# Patient Record
Sex: Female | Born: 1945
Health system: Southern US, Community
[De-identification: ages and names within clinical notes are randomized; demographics above are authoritative.]

## PROBLEM LIST (undated history)

## (undated) DIAGNOSIS — E785 Hyperlipidemia, unspecified: Secondary | ICD-10-CM

## (undated) DIAGNOSIS — F329 Major depressive disorder, single episode, unspecified: Secondary | ICD-10-CM

## (undated) DIAGNOSIS — I10 Essential (primary) hypertension: Secondary | ICD-10-CM

## (undated) DIAGNOSIS — E559 Vitamin D deficiency, unspecified: Secondary | ICD-10-CM

## (undated) DIAGNOSIS — F32A Depression, unspecified: Secondary | ICD-10-CM

## (undated) DIAGNOSIS — M8580 Other specified disorders of bone density and structure, unspecified site: Secondary | ICD-10-CM

## (undated) DIAGNOSIS — T7840XA Allergy, unspecified, initial encounter: Secondary | ICD-10-CM

## (undated) DIAGNOSIS — R7303 Prediabetes: Secondary | ICD-10-CM

## (undated) DIAGNOSIS — A159 Respiratory tuberculosis unspecified: Secondary | ICD-10-CM

## (undated) DIAGNOSIS — M1990 Unspecified osteoarthritis, unspecified site: Secondary | ICD-10-CM

## (undated) DIAGNOSIS — H2000 Unspecified acute and subacute iridocyclitis: Secondary | ICD-10-CM

## (undated) DIAGNOSIS — F419 Anxiety disorder, unspecified: Secondary | ICD-10-CM

## (undated) HISTORY — PX: DILATION AND CURETTAGE OF UTERUS: SHX78

## (undated) HISTORY — DX: Essential (primary) hypertension: I10

## (undated) HISTORY — DX: Prediabetes: R73.03

## (undated) HISTORY — DX: Other specified disorders of bone density and structure, unspecified site: M85.80

## (undated) HISTORY — DX: Vitamin D deficiency, unspecified: E55.9

## (undated) HISTORY — DX: Major depressive disorder, single episode, unspecified: F32.9

## (undated) HISTORY — DX: Depression, unspecified: F32.A

## (undated) HISTORY — DX: Allergy, unspecified, initial encounter: T78.40XA

## (undated) HISTORY — DX: Unspecified acute and subacute iridocyclitis: H20.00

## (undated) HISTORY — DX: Unspecified osteoarthritis, unspecified site: M19.90

## (undated) HISTORY — DX: Anxiety disorder, unspecified: F41.9

## (undated) HISTORY — DX: Hyperlipidemia, unspecified: E78.5

## (undated) HISTORY — PX: TONSILLECTOMY AND ADENOIDECTOMY: SHX28

---

## 1999-10-01 ENCOUNTER — Emergency Department (HOSPITAL_COMMUNITY): Admission: EM | Admit: 1999-10-01 | Discharge: 1999-10-01 | Payer: Self-pay | Admitting: Emergency Medicine

## 2000-01-22 ENCOUNTER — Other Ambulatory Visit: Admission: RE | Admit: 2000-01-22 | Discharge: 2000-01-22 | Payer: Self-pay | Admitting: *Deleted

## 2001-09-22 ENCOUNTER — Other Ambulatory Visit: Admission: RE | Admit: 2001-09-22 | Discharge: 2001-09-22 | Payer: Self-pay | Admitting: Internal Medicine

## 2004-06-17 ENCOUNTER — Other Ambulatory Visit: Admission: RE | Admit: 2004-06-17 | Discharge: 2004-06-17 | Payer: Self-pay | Admitting: Internal Medicine

## 2004-08-15 ENCOUNTER — Ambulatory Visit (HOSPITAL_COMMUNITY): Admission: RE | Admit: 2004-08-15 | Discharge: 2004-08-15 | Payer: Self-pay | Admitting: Gastroenterology

## 2004-09-04 ENCOUNTER — Encounter: Admission: RE | Admit: 2004-09-04 | Discharge: 2004-09-04 | Payer: Self-pay | Admitting: Gastroenterology

## 2006-11-15 ENCOUNTER — Ambulatory Visit (HOSPITAL_COMMUNITY): Admission: RE | Admit: 2006-11-15 | Discharge: 2006-11-15 | Payer: Self-pay | Admitting: Chiropractic Medicine

## 2007-05-26 ENCOUNTER — Encounter: Admission: RE | Admit: 2007-05-26 | Discharge: 2007-05-26 | Payer: Self-pay | Admitting: Sports Medicine

## 2007-06-09 ENCOUNTER — Encounter: Admission: RE | Admit: 2007-06-09 | Discharge: 2007-06-09 | Payer: Self-pay | Admitting: Sports Medicine

## 2008-09-25 ENCOUNTER — Other Ambulatory Visit: Admission: RE | Admit: 2008-09-25 | Discharge: 2008-09-25 | Payer: Self-pay | Admitting: Internal Medicine

## 2009-09-03 ENCOUNTER — Encounter: Admission: RE | Admit: 2009-09-03 | Discharge: 2009-09-03 | Payer: Self-pay | Admitting: Internal Medicine

## 2010-04-06 ENCOUNTER — Encounter: Payer: Self-pay | Admitting: Gastroenterology

## 2010-04-07 ENCOUNTER — Encounter: Payer: Self-pay | Admitting: Internal Medicine

## 2010-08-01 NOTE — Consult Note (Signed)
[place]  Patient:    Allison Barker, Allison Barker                         MRN: [id number] Proc. Date: 10/01/99 Adm. Date:  [id number] Attending:  Doug Sou                          Consultation Report  EMERGENCY ROOM NOTE  REASON FOR CONSULTATION:  Epistaxis.  HISTORY OF PRESENT ILLNESS:  This is a 65 year old lady who was seen in our office three days prior and was found to have a small arterial bleeder in the right anterior septum.  This was cauterized with silver nitrate, and she did well until last night when she started having some small amounts of bleeding and then this morning had heavy bleeding.  She was evaluated in the emergency room.  Nasal exam reveals cotton packing with cocaine in the right nasal cavity.  The left side is clear.  The packing was removed.  There is a silver nitrate cautery site on the anterior septum on the right side.  This area was abraded with a Frazier tip suction, and this resulted in brisk bleeding from the posteroinferior aspect of the cautery site.  This area was infiltrated with Xylocaine with epinephrine.  A suction cautery device was used on a setting of 20 to cauterize this vessel thoroughly.  There was no further bleeding.  The nasal cavity was cleaned of old blood and clot.  She was observed in the emergency room for 30 minutes and discharged to home in good condition without any further bleeding.  DISCHARGE INSTRUCTIONS:  She was instructed to use Vaseline ointment in her nose several times a day and nasal saline spray to keep things moist.  FOLLOW-UP:  She was instructed to follow up in one to two weeks if she is doing well, or sooner if she has any further bleeding. DD:  10/01/99 TD:  10/01/99 Job: [id number]

## 2010-08-01 NOTE — Op Note (Signed)
Allison Barker, Allison Barker                ACCOUNT NO.:  [id number]   MEDICAL RECORD NO.:  [id number]          PATIENT TYPE:  AMB   LOCATION:  ENDO                         FACILITY:  [facility]   PHYSICIAN:  Petra Kuba, M.D.    DATE OF BIRTH:  26-Dec-1945   DATE OF PROCEDURE:  08/15/2004  DATE OF DISCHARGE:                                 OPERATIVE REPORT   PROCEDURE:  Colonoscopy.   INDICATIONS:  Patient overdue for colonic screening with lower abdominal  pain and increasing constipation.  Consent was signed after the risks,  benefits, methods and options were thoroughly discussed in the office.   MEDICINES USED:  1.  Demerol 60 mg.  2.  Versed 10 mg.   DESCRIPTION OF PROCEDURE:  Rectal inspection was pertinent for external  hemorrhoids.  Digital exam was negative.  The video pediatric colonoscope  was inserted and with some abdominal pressure was easily able to be advanced  to the cecum.  No abnormalities were seen on insertion.  We did advance into  the terminal ileum, which was normal.  Photo documentation was obtained.  The scope was slowly withdrawn.  The cecum was identified by the appendiceal  orifice and the ileocecal valve.  The prep was fairly adequate.  There was  some stool adherent to the wall which could not be washed off, but with  moderate washing and suctioning, fairly adequate visualization was obtained.  On slow withdrawal back to the rectum, no polyps, tumors, masses,  diverticula or any other abnormalities were seen as we slowly withdrew back  to the rectum.  Anorectal pull through and retroflexion confirmed some small  hemorrhoids.  The scope was straightened and readvanced a short ways up the  left side of the colon.  Air was suctioned and the scope was removed.  The  patient tolerated the procedure well.  There was no obvious immediate  complication.   ENDOSCOPIC DIAGNOSES:  1.  Internal and external hemorrhoids.  2.  Otherwise within normal limits to the terminal  ileum.   PLAN:  Recheck colon screening in five years.  Happy to see back p.r.n.,  particularly if symptoms continue.  Might need to try stool softeners,  fibers, increased fluids, etc.  If pain continues, will probably get a CT  scan next.  Could leave that to either Dr. Ambrose Mantle or Dr. Elisabeth Most or as  above.  Happy to see back.      MEM/MEDQ  D:  08/15/2004  T:  08/17/2004  Job:  [id number]   cc:   Lovenia Kim, D.O.  [address]  [place]  [place] [address]  Fax: [phone]   Malachi Pro. Ambrose Mantle, M.D.  [address]  [place]  [place] [address]  Fax: [phone]

## 2010-10-06 ENCOUNTER — Other Ambulatory Visit (HOSPITAL_COMMUNITY)
Admission: RE | Admit: 2010-10-06 | Discharge: 2010-10-06 | Disposition: A | Discharge status: Home / Self Care | Payer: BC Managed Care – PPO | Source: Ambulatory Visit | Attending: Internal Medicine | Admitting: Internal Medicine

## 2010-10-06 DIAGNOSIS — Z01419 Encounter for gynecological examination (general) (routine) without abnormal findings: Secondary | ICD-10-CM | POA: Insufficient documentation

## 2011-08-05 ENCOUNTER — Ambulatory Visit (HOSPITAL_COMMUNITY)
Admission: RE | Admit: 2011-08-05 | Discharge: 2011-08-05 | Disposition: A | Discharge status: Home / Self Care | Payer: Medicare Other | Source: Ambulatory Visit | Attending: Internal Medicine | Admitting: Internal Medicine

## 2011-08-05 ENCOUNTER — Other Ambulatory Visit (HOSPITAL_COMMUNITY): Payer: Self-pay | Admitting: Internal Medicine

## 2011-08-05 DIAGNOSIS — J40 Bronchitis, not specified as acute or chronic: Secondary | ICD-10-CM | POA: Insufficient documentation

## 2011-08-05 DIAGNOSIS — R05 Cough: Secondary | ICD-10-CM

## 2011-08-05 DIAGNOSIS — R059 Cough, unspecified: Secondary | ICD-10-CM | POA: Insufficient documentation

## 2012-11-14 HISTORY — PX: EYE SURGERY: SHX253

## 2013-03-08 ENCOUNTER — Other Ambulatory Visit: Payer: Self-pay | Admitting: Emergency Medicine

## 2013-03-08 MED ORDER — BISOPROLOL-HYDROCHLOROTHIAZIDE 10-6.25 MG PO TABS: 1.0000 | ORAL_TABLET | Freq: Every day | ORAL | Status: DC

## 2013-03-13 ENCOUNTER — Other Ambulatory Visit: Payer: Self-pay | Admitting: Emergency Medicine

## 2013-03-13 MED ORDER — BISOPROLOL-HYDROCHLOROTHIAZIDE 5-6.25 MG PO TABS: 1.0000 | ORAL_TABLET | Freq: Every day | ORAL | Status: DC

## 2013-06-08 ENCOUNTER — Encounter: Payer: Self-pay | Admitting: Physician Assistant

## 2013-06-08 ENCOUNTER — Ambulatory Visit (INDEPENDENT_AMBULATORY_CARE_PROVIDER_SITE_OTHER): Payer: Medicare Other | Admitting: Physician Assistant

## 2013-06-08 VITALS — BP 120/72 | HR 72 | Temp 98.4°F | Resp 16 | Ht 63.5 in | Wt 170.0 lb

## 2013-06-08 DIAGNOSIS — R7309 Other abnormal glucose: Secondary | ICD-10-CM | POA: Insufficient documentation

## 2013-06-08 DIAGNOSIS — Z79899 Other long term (current) drug therapy: Secondary | ICD-10-CM

## 2013-06-08 DIAGNOSIS — J329 Chronic sinusitis, unspecified: Secondary | ICD-10-CM

## 2013-06-08 DIAGNOSIS — R7303 Prediabetes: Secondary | ICD-10-CM

## 2013-06-08 DIAGNOSIS — F411 Generalized anxiety disorder: Secondary | ICD-10-CM

## 2013-06-08 DIAGNOSIS — E785 Hyperlipidemia, unspecified: Secondary | ICD-10-CM

## 2013-06-08 DIAGNOSIS — I10 Essential (primary) hypertension: Secondary | ICD-10-CM | POA: Insufficient documentation

## 2013-06-08 DIAGNOSIS — E559 Vitamin D deficiency, unspecified: Secondary | ICD-10-CM | POA: Insufficient documentation

## 2013-06-08 MED ORDER — PREDNISONE 20 MG PO TABS: ORAL_TABLET | ORAL | Status: DC

## 2013-06-08 MED ORDER — FUROSEMIDE 40 MG PO TABS: 40.0000 mg | ORAL_TABLET | Freq: Every day | ORAL | Status: DC

## 2013-06-08 MED ORDER — ALPRAZOLAM 0.5 MG PO TABS: 0.5000 mg | ORAL_TABLET | Freq: Every evening | ORAL | Status: DC | PRN

## 2013-06-08 MED ORDER — PROMETHAZINE-CODEINE 6.25-10 MG/5ML PO SYRP: 5.0000 mL | ORAL_SOLUTION | Freq: Four times a day (QID) | ORAL | Status: DC | PRN

## 2013-06-08 MED ORDER — AZITHROMYCIN 250 MG PO TABS: ORAL_TABLET | ORAL | Status: DC

## 2013-06-08 NOTE — Progress Notes (Signed)
HPI 68 y.o. female  presents for 3 month follow up with hypertension, hyperlipidemia, prediabetes and vitamin D. Her blood pressure has been controlled at home, today their BP is BP: 120/72 mmHg She does not workout. She denies chest pain, shortness of breath, dizziness.  She is not on cholesterol medication and denies myalgias. Her cholesterol is not at goal. The cholesterol last visit was:  LDL 120 She has been working on diet and exercise for prediabetes, and denies paresthesia of the feet, polydipsia, polyuria and visual disturbances. Last A1C in the office was: 5.5 She has been having dry cough, rhinorrhea for one week, she has been taking zyrtec and mucinex that has not helped. Denies SOB, wheezing.  Patient is on Vitamin D supplement.    Current Medications:    Medication List       This list is accurate as of: 06/08/13  5:36 PM.  Always use your most recent med list.               acyclovir 400 MG tablet  Commonly known as:  ZOVIRAX  Take 400 mg by mouth 2 (two) times daily.     ALPRAZolam 0.5 MG tablet  Commonly known as:  XANAX  Take 1 tablet (0.5 mg total) by mouth at bedtime as needed for anxiety.     aspirin 81 MG tablet  Take 81 mg by mouth every other day.     azithromycin 250 MG tablet  Commonly known as:  ZITHROMAX  2 tablets by mouth today then one tablet daily for 4 days.     bisoprolol-hydrochlorothiazide 5-6.25 MG per tablet  Commonly known as:  ZIAC  Take 1 tablet by mouth daily.     cetirizine 10 MG tablet  Commonly known as:  ZYRTEC  Take 10 mg by mouth daily.     furosemide 40 MG tablet  Commonly known as:  LASIX  Take 1 tablet (40 mg total) by mouth daily.     LORazepam 2 MG tablet  Commonly known as:  ATIVAN  Take 2 mg by mouth at bedtime as needed for anxiety.     potassium chloride 10 MEQ tablet  Commonly known as:  K-DUR,KLOR-CON  Take 10 mEq by mouth 2 (two) times daily.     pravastatin 40 MG tablet  Commonly known as:  PRAVACHOL   Take 40 mg by mouth daily.     predniSONE 20 MG tablet  Commonly known as:  DELTASONE  Take one pill two times daily for 3 days, take one pill daily for 4 days.     promethazine-codeine 6.25-10 MG/5ML syrup  Commonly known as:  PHENERGAN with CODEINE  Take 5 mLs by mouth every 6 (six) hours as needed for cough.        Medical History:  Past Medical History  Diagnosis Date  . Hypertension   . Hyperlipidemia   . Depression   . Allergy   . Anxiety   . Arthritis   . Osteopenia   . Vitamin D deficiency   . Prediabetes   . Acute iritis of left eye     secondary to HSV   Allergies: Allergies not on file   Review of Systems: [X]  = complains of  [ ]  = denies  General: Fatigue [ ]  Fever [ ]  Chills [ ]  Weakness [ ]   Insomnia [ ]  Eyes: Redness [ ]  Blurred vision [ ]  Diplopia [ ]   ENT: Congestion Arly.Keller ] Sinus Pain Arly.Keller ] Post Nasal Drip [ ]   Sore Throat [ ]  Earache [ ]   Cardiac: Chest pain/pressure [ ]  SOB [ ]  Orthopnea [ ]   Palpitations [ ]   Paroxysmal nocturnal dyspnea[ ]  Claudication [ ]  Edema [ ]   Pulmonary: Cough Arly.Keller[X ] Wheezing[ ]   SOB [ ]   Snoring [ ]   GI: Nausea [ ]  Vomiting[ ]  Dysphagia[ ]  Heartburn[ ]  Abdominal pain [ ]  Constipation [ ] ; Diarrhea [ ] ; BRBPR [ ]  Melena[ ]  GU: Hematuria[ ]  Dysuria [ ]  Nocturia[ ]  Urgency [ ]   Hesitancy [ ]  Discharge [ ]  Neuro: Headaches[ ]  Vertigo[ ]  Paresthesias[ ]  Spasm [ ]  Speech changes [ ]  Incoordination [ ]   Ortho: Arthritis [ ]  Joint pain [ ]  Muscle pain [ ]  Joint swelling [ ]  Back Pain [ ]  Skin:  Rash [ ]   Pruritis [ ]  Change in skin lesion [ ]   Psych: Depression[ ]  Anxiety[ ]  Confusion [ ]  Memory loss [ ]   Heme/Lypmh: Bleeding [ ]  Bruising [ ]  Enlarged lymph nodes [ ]   Endocrine: Visual blurring [ ]  Paresthesia [ ]  Polyuria [ ]  Polydypsea [ ]    Heat/cold intolerance [ ]  Hypoglycemia [ ]   Family history- Review and unchanged Social history- Review and unchanged Physical Exam: BP 120/72  Pulse 72  Temp(Src) 98.4 F (36.9 C)  Resp 16   Ht 5' 3.5" (1.613 m)  Wt 170 lb (77.111 kg)  BMI 29.64 kg/m2 Wt Readings from Last 3 Encounters:  06/08/13 170 lb (77.111 kg)   General Appearance: Well nourished, in no apparent distress. Eyes: PERRLA, EOMs, conjunctiva no swelling or erythema Sinuses: + Frontal/maxillary tenderness ENT/Mouth: Ext aud canals clear, TMs without erythema, bulging. No erythema, swelling, or exudate on post pharynx.  Tonsils not swollen or erythematous. Hearing normal.  Neck: Supple, thyroid normal.  Respiratory: Respiratory effort normal, BS equal bilaterally without rales, rhonchi, wheezing or stridor.  Cardio: RRR with no MRGs. Brisk peripheral pulses without edema.  Abdomen: Soft, + BS.  Non tender, no guarding, rebound, hernias, masses. Lymphatics: Non tender without lymphadenopathy.  Musculoskeletal: Full ROM, 5/5 strength, normal gait.  Skin: Warm, dry without rashes, lesions, ecchymosis.  Neuro: Cranial nerves intact. Normal muscle tone, no cerebellar symptoms. Sensation intact.  Psych: Awake and oriented X 3, normal affect, Insight and Judgment appropriate.   Assessment and Plan:  Hypertension: Continue medication, monitor blood pressure at home. Continue DASH diet. Cholesterol: Continue diet and exercise. Check cholesterol.  Pre-diabetes-Continue diet and exercise. Check A1C Vitamin D Def- check level and continue medications.  Sinusitis- Zpak, Prednisone 10  Continue diet and meds as discussed. Further disposition pending results of labs. OVER 40 minutes of exam, counseling, chart review, referral performed  Allison Barker, Allison Barker 4:23 PM

## 2013-06-08 NOTE — Patient Instructions (Signed)

## 2013-06-09 LAB — CBC WITH DIFFERENTIAL/PLATELET
BASOS ABS: 0.1 10*3/uL (ref 0.0–0.1)
BASOS PCT: 1 % (ref 0–1)
EOS ABS: 0.3 10*3/uL (ref 0.0–0.7)
Eosinophils Relative: 4 % (ref 0–5)
HCT: 44.3 % (ref 36.0–46.0)
HEMOGLOBIN: 15.5 g/dL — AB (ref 12.0–15.0)
LYMPHS ABS: 2.3 10*3/uL (ref 0.7–4.0)
LYMPHS PCT: 30 % (ref 12–46)
MCH: 31.8 pg (ref 26.0–34.0)
MCHC: 35 g/dL (ref 30.0–36.0)
MCV: 91 fL (ref 78.0–100.0)
MONOS PCT: 10 % (ref 3–12)
Monocytes Absolute: 0.8 10*3/uL (ref 0.1–1.0)
NEUTROS ABS: 4.2 10*3/uL (ref 1.7–7.7)
NEUTROS PCT: 55 % (ref 43–77)
Platelets: 397 10*3/uL (ref 150–400)
RBC: 4.87 MIL/uL (ref 3.87–5.11)
RDW: 13.1 % (ref 11.5–15.5)
WBC: 7.7 10*3/uL (ref 4.0–10.5)

## 2013-06-09 LAB — BASIC METABOLIC PANEL WITH GFR
BUN: 9 mg/dL (ref 6–23)
CALCIUM: 9.6 mg/dL (ref 8.4–10.5)
CO2: 27 mEq/L (ref 19–32)
CREATININE: 0.86 mg/dL (ref 0.50–1.10)
Chloride: 102 mEq/L (ref 96–112)
GFR, EST NON AFRICAN AMERICAN: 70 mL/min
GFR, Est African American: 81 mL/min
Glucose, Bld: 99 mg/dL (ref 70–99)
Potassium: 4.4 mEq/L (ref 3.5–5.3)
SODIUM: 140 meq/L (ref 135–145)

## 2013-06-09 LAB — LIPID PANEL
Cholesterol: 173 mg/dL (ref 0–200)
HDL: 41 mg/dL (ref >39)
LDL Cholesterol: 74 mg/dL (ref 0–99)
Total CHOL/HDL Ratio: 4.2 Ratio
Triglycerides: 290 mg/dL — ABNORMAL HIGH (ref <150)
VLDL: 58 mg/dL — AB (ref 0–40)

## 2013-06-09 LAB — HEPATIC FUNCTION PANEL
ALBUMIN: 4 g/dL (ref 3.5–5.2)
ALK PHOS: 74 U/L (ref 39–117)
ALT: 16 U/L (ref 0–35)
AST: 24 U/L (ref 0–37)
BILIRUBIN DIRECT: 0.1 mg/dL (ref 0.0–0.3)
BILIRUBIN INDIRECT: 0.3 mg/dL (ref 0.2–1.2)
Total Bilirubin: 0.4 mg/dL (ref 0.2–1.2)
Total Protein: 7.3 g/dL (ref 6.0–8.3)

## 2013-06-09 LAB — VITAMIN D 25 HYDROXY (VIT D DEFICIENCY, FRACTURES): VIT D 25 HYDROXY: 87 ng/mL (ref 30–89)

## 2013-06-09 LAB — MAGNESIUM: Magnesium: 2.1 mg/dL (ref 1.5–2.5)

## 2013-06-09 LAB — HEMOGLOBIN A1C
HEMOGLOBIN A1C: 5.9 % — AB (ref <5.7)
Mean Plasma Glucose: 123 mg/dL — ABNORMAL HIGH (ref <117)

## 2013-06-09 LAB — TSH: TSH: 2.007 u[IU]/mL (ref 0.350–4.500)

## 2013-06-27 ENCOUNTER — Other Ambulatory Visit: Payer: Self-pay | Admitting: Emergency Medicine

## 2013-07-10 ENCOUNTER — Other Ambulatory Visit: Payer: Self-pay | Admitting: Internal Medicine

## 2013-08-15 ENCOUNTER — Ambulatory Visit: Payer: Self-pay | Admitting: Emergency Medicine

## 2013-08-17 ENCOUNTER — Encounter: Payer: Self-pay | Admitting: Internal Medicine

## 2013-08-17 ENCOUNTER — Ambulatory Visit (INDEPENDENT_AMBULATORY_CARE_PROVIDER_SITE_OTHER): Payer: Medicare Other | Admitting: Internal Medicine

## 2013-08-17 VITALS — BP 126/84 | HR 72 | Temp 97.7°F | Resp 16 | Ht 63.5 in | Wt 174.8 lb

## 2013-08-17 DIAGNOSIS — R319 Hematuria, unspecified: Secondary | ICD-10-CM

## 2013-08-17 NOTE — Progress Notes (Signed)
Subjective:    Patient ID: Allison Barker, female    DOB: Feb 25, 1946, 68 y.o.   MRN: [id number]  HPI Very nice 68 yo MWF presenting with c/o "blood in urine" noted on one occasion and also noted with wiping. In 2006 she had neg cystoscopy by Dr Vernie Ammons. More recently she has been followed at the [facility]. Currently she denoie any UT Sx's as dysuria, frequency or urgency.  Current Outpatient Prescriptions on File Prior to Visit  Medication Sig Dispense Refill  . acyclovir (ZOVIRAX) 400 MG tablet Take 400 mg by mouth 2 (two) times daily.      Marland Kitchen ALPRAZolam (XANAX) 0.5 MG tablet Take 1 tablet (0.5 mg total) by mouth at bedtime as needed for anxiety.  90 tablet  1  . aspirin 81 MG tablet Take 81 mg by mouth every other day.      . bisoprolol-hydrochlorothiazide (ZIAC) 5-6.25 MG per tablet TAKE ONE TABLET BY MOUTH ONCE DAILY  90 tablet  0  . cetirizine (ZYRTEC) 10 MG tablet Take 10 mg by mouth daily.      . furosemide (LASIX) 40 MG tablet Take 1 tablet (40 mg total) by mouth daily.  90 tablet  1  . LORazepam (ATIVAN) 2 MG tablet TAKE ONE-HALF TO ONE TABLET BY MOUTH AT BEDTIME AS NEEDED FOR SLEEP  30 tablet  1  . potassium chloride (K-DUR,KLOR-CON) 10 MEQ tablet Take 10 mEq by mouth 2 (two) times daily.      . pravastatin (PRAVACHOL) 40 MG tablet Take 40 mg by mouth daily.       No current facility-administered medications on file prior to visit.   Allergies not on file Past Medical History  Diagnosis Date  . Hypertension   . Hyperlipidemia   . Depression   . Allergy   . Anxiety   . Arthritis   . Osteopenia   . Vitamin D deficiency   . Prediabetes   . 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  A full 10 point Review of Systems was done, except as stated above, all other Review of Systems were negative  Objective:   Physical Exam  BP 126/84  Pulse 72  T 97.7 F   R 16  Ht 5' 3.5"   Wt 174 lb 12.8 oz   BMI 30.48 kg/m2  HEENT - Eac's patent. TM's Nl.EOM's full. PERRLA. NasoOroPharynx clear. Neck - supple. Nl Thyroid. No bruits nodes JVD Chest - Clear equal BS Cor - Nl HS. RRR w/o sig MGR. PP 1(+) No edema. Abd - No palpable organomegaly, masses or tenderness. BS nl. No CVA tenderness MS- FROM. w/o deformities. Muscle power tone and bulk Nl. Gait Nl. Neuro - No obvious Cr N abnormalities. Sensory, motor and Cerebellar functions appear Nl w/o focal abnormalities.    Assessment & Plan:   1. Hematuria -  Urine Microscopic - Urine culture

## 2013-08-17 NOTE — Patient Instructions (Signed)
Hematuria, Adult °Hematuria is blood in your urine. It can be caused by a bladder infection, kidney infection, prostate infection, kidney stone, or cancer of your urinary tract. Infections can usually be treated with medicine, and a kidney stone usually will pass through your urine. If neither of these is the cause of your hematuria, further workup to find out the reason may be needed. °It is very important that you tell your health care provider about any blood you see in your urine, even if the blood stops without treatment or happens without causing pain. Blood in your urine that happens and then stops and then happens again can be a symptom of a very serious condition. Also, pain is not a symptom in the initial stages of many urinary cancers. °HOME CARE INSTRUCTIONS  °· Drink lots of fluid, 3 4 quarts a day. If you have been diagnosed with an infection, cranberry juice is especially recommended, in addition to large amounts of water. °· Avoid caffeine, tea, and carbonated beverages, because they tend to irritate the bladder. °· Avoid alcohol because it may irritate the prostate. °· Only take over-the-counter or prescription medicines for pain, discomfort, or fever as directed by your health care provider. °· If you have been diagnosed with a kidney stone, follow your health care provider's instructions regarding straining your urine to catch the stone. °· Empty your bladder often. Avoid holding urine for long periods of time. °· After a bowel movement, women should cleanse front to back. Use each tissue only once. °· Empty your bladder before and after sexual intercourse if you are a female. °SEEK MEDICAL CARE IF: °You develop back pain, fever, a feeling of sickness in your stomach (nausea), or vomiting or if your symptoms are not better in 3 days. Return sooner if you are getting worse. °SEEK IMMEDIATE MEDICAL CARE IF:  °· You have a persistent fever, with a temperature of 101.8°F (38.8°C) or greater. °· You  develop severe vomiting and are unable to keep the medicine down. °· You develop severe back or abdominal pain despite taking your medicines. °· You begin passing a large amount of blood or clots in your urine. °· You feel extremely weak or faint, or you pass out. °MAKE SURE YOU:  °· Understand these instructions. °· Will watch your condition. °· Will get help right away if you are not doing well or get worse. °Document Released: 03/02/2005 Document Revised: 12/21/2012 Document Reviewed: 10/31/2012 °[employer]® Patient Information ©2014 [employer]. ° °

## 2013-08-18 LAB — URINALYSIS, MICROSCOPIC ONLY
BACTERIA UA: NONE SEEN
CRYSTALS: NONE SEEN

## 2013-08-19 LAB — URINE CULTURE: Colony Count: 85000

## 2013-10-04 ENCOUNTER — Other Ambulatory Visit: Payer: Self-pay

## 2013-10-04 MED ORDER — FUROSEMIDE 40 MG PO TABS: 40.0000 mg | ORAL_TABLET | Freq: Every day | ORAL | Status: DC

## 2013-10-12 ENCOUNTER — Other Ambulatory Visit: Payer: Self-pay | Admitting: *Deleted

## 2013-10-12 MED ORDER — BISOPROLOL-HYDROCHLOROTHIAZIDE 5-6.25 MG PO TABS: ORAL_TABLET | ORAL | Status: DC

## 2013-10-12 MED ORDER — PRAVASTATIN SODIUM 40 MG PO TABS: 40.0000 mg | ORAL_TABLET | Freq: Every day | ORAL | Status: DC

## 2013-10-17 ENCOUNTER — Encounter: Payer: Self-pay | Admitting: Emergency Medicine

## 2013-11-02 ENCOUNTER — Other Ambulatory Visit: Payer: Self-pay | Admitting: *Deleted

## 2013-11-02 MED ORDER — LORAZEPAM 2 MG PO TABS: ORAL_TABLET | ORAL | Status: DC

## 2013-11-13 ENCOUNTER — Ambulatory Visit (INDEPENDENT_AMBULATORY_CARE_PROVIDER_SITE_OTHER): Payer: Self-pay | Admitting: Emergency Medicine

## 2013-11-13 ENCOUNTER — Other Ambulatory Visit: Payer: Self-pay | Admitting: Emergency Medicine

## 2013-11-13 ENCOUNTER — Encounter: Payer: Self-pay | Admitting: Emergency Medicine

## 2013-11-13 VITALS — BP 118/70 | HR 68 | Temp 98.0°F | Resp 18 | Ht 63.5 in | Wt 170.0 lb

## 2013-11-13 DIAGNOSIS — Z0000 Encounter for general adult medical examination without abnormal findings: Secondary | ICD-10-CM

## 2013-11-13 DIAGNOSIS — Z1331 Encounter for screening for depression: Secondary | ICD-10-CM

## 2013-11-13 DIAGNOSIS — I10 Essential (primary) hypertension: Secondary | ICD-10-CM

## 2013-11-13 DIAGNOSIS — R5381 Other malaise: Secondary | ICD-10-CM

## 2013-11-13 DIAGNOSIS — F3289 Other specified depressive episodes: Secondary | ICD-10-CM

## 2013-11-13 DIAGNOSIS — F329 Major depressive disorder, single episode, unspecified: Secondary | ICD-10-CM

## 2013-11-13 DIAGNOSIS — Z23 Encounter for immunization: Secondary | ICD-10-CM

## 2013-11-13 DIAGNOSIS — R5383 Other fatigue: Secondary | ICD-10-CM

## 2013-11-13 DIAGNOSIS — F32A Depression, unspecified: Secondary | ICD-10-CM

## 2013-11-13 DIAGNOSIS — R7309 Other abnormal glucose: Secondary | ICD-10-CM

## 2013-11-13 DIAGNOSIS — E559 Vitamin D deficiency, unspecified: Secondary | ICD-10-CM

## 2013-11-13 DIAGNOSIS — E782 Mixed hyperlipidemia: Secondary | ICD-10-CM

## 2013-11-13 DIAGNOSIS — Z789 Other specified health status: Secondary | ICD-10-CM

## 2013-11-13 LAB — CBC WITH DIFFERENTIAL/PLATELET
Basophils Absolute: 0.1 10*3/uL (ref 0.0–0.1)
Basophils Relative: 1 % (ref 0–1)
Eosinophils Absolute: 0.2 10*3/uL (ref 0.0–0.7)
Eosinophils Relative: 2 % (ref 0–5)
HEMATOCRIT: 46.3 % — AB (ref 36.0–46.0)
HEMOGLOBIN: 15.9 g/dL — AB (ref 12.0–15.0)
LYMPHS PCT: 37 % (ref 12–46)
Lymphs Abs: 3 10*3/uL (ref 0.7–4.0)
MCH: 31.5 pg (ref 26.0–34.0)
MCHC: 34.3 g/dL (ref 30.0–36.0)
MCV: 91.7 fL (ref 78.0–100.0)
MONO ABS: 0.7 10*3/uL (ref 0.1–1.0)
MONOS PCT: 9 % (ref 3–12)
NEUTROS ABS: 4.1 10*3/uL (ref 1.7–7.7)
NEUTROS PCT: 51 % (ref 43–77)
Platelets: 342 10*3/uL (ref 150–400)
RBC: 5.05 MIL/uL (ref 3.87–5.11)
RDW: 13.2 % (ref 11.5–15.5)
WBC: 8 10*3/uL (ref 4.0–10.5)

## 2013-11-13 MED ORDER — CITALOPRAM HYDROBROMIDE 40 MG PO TABS: 40.0000 mg | ORAL_TABLET | Freq: Every day | ORAL | Status: DC

## 2013-11-13 NOTE — Patient Instructions (Signed)
   Start 1/2 tablet Celexa daily x 2 weeks then increase to whole tablet. Make sure to decrease dose and not stop suddenly when ready to discontinue medicine when depression improves.  Depression Depression is feeling sad, low, down in the dumps, blue, gloomy, or empty. In general, there are two kinds of depression:  Normal sadness or grief. This can happen after something upsetting. It often goes away on its own within 2 weeks. After losing a loved one (bereavement), normal sadness and grief may last longer than two weeks. It usually gets better with time.  Clinical depression. This kind lasts longer than normal sadness or grief. It keeps you from doing the things you normally do in life. It is often hard to function at home, work, or at school. It may affect your relationships with others. Treatment is often needed. GET HELP RIGHT AWAY IF:  You have thoughts about hurting yourself or others.  You lose touch with reality (psychotic symptoms). You may:  See or hear things that are not real.  Have untrue beliefs about your life or people around you.  Your medicine is giving you problems. MAKE SURE YOU:  Understand these instructions.  Will watch your condition.  Will get help right away if you are not doing well or get worse. Document Released: 04/04/2010 Document Revised: 07/17/2013 Document Reviewed: 07/02/2011 [facility] Patient Information 2015 [place], [place]. This information is not intended to replace advice given to you by your health care provider. Make sure you discuss any questions you have with your health care provider.

## 2013-11-13 NOTE — Progress Notes (Signed)
Patient ID: TAYONA SARNOWSKI, female   DOB: February 22, 1946, 68 y.o.   MRN: [id number] MEDICARE ANNUAL WELLNESS VISIT AND CPE  Assessment:  1. CPE/ medicare wellness update- Update screening labs/ History/ Immunizations/ Testing as needed. Advised healthy diet, QD exercise, increase H20 and continue RX/ Vitamins AD.  2. 3 month F/U for HTN, Cholesterol, Pre-Dm, D. Deficient. Needs healthy diet, cardio QD and obtain healthy weight. Check Labs, Check BP if >130/80 call office   3. Depression/ Fatigue- check labs, increase activity and H2O. Start 1/2 tablet Celexa daily x 2 weeks then increase to whole tablet. Make sure to decrease dose and not stop suddenly when ready to discontinue medicine when depression improves. F/U with results  Plan:   During the course of the visit the patient was educated and counseled about appropriate screening and preventive services including:    Pneumococcal vaccine   Influenza vaccine  Td vaccine  Screening electrocardiogram  Screening mammography  Bone densitometry screening  Colorectal cancer screening  Diabetes screening  Glaucoma screening  Nutrition counseling   Advanced directives: given information/requested  Screening recommendations, referrals:  Vaccinations: Tdap vaccine not indicated Influenza vaccine ordered Pneumococcal vaccine declined Shingles vaccine not indicated Hep B vaccine not indicated  Nutrition assessed and recommended  Colonoscopy not indicated Mammogram not indicated Pap smear not indicated Pelvic exam not indicated Recommended yearly ophthalmology/optometry visit for glaucoma screening and checkup Recommended yearly dental visit for hygiene and checkup Advanced directives - not indicated  Conditions/risks identified: BMI: Discussed weight loss, diet, and increase physical activity.  Increase physical activity: AHA recommends 150 minutes of physical activity a week.  Medications reviewed DEXA- not  indicated Diabetes at goal, ACE/ARB therapy No, Reason not on Ace Inhibitor/ARB therapy:  n/a Urinary Incontinence is not an issue: discussed non pharmacology and pharmacology options.  Fall risk: low- discussed PT, home fall assessment, medications.   Subjective:   DENICA WEB is a 68 y.o. female who presents for Medicare Annual Wellness Visit and complete physical.    Date of last medicare wellness visit is unknown.  She has been mild depressed since losing dog and dad. She has tried hospice counseling. She is managing some days better than others. She has mild fatigue with depression.   She has had elevated blood pressure. Her blood pressure has been controlled at home, today their BP is BP: 118/70 mmHg She does not workout. She denies chest pain, shortness of breath, dizziness.  She is on cholesterol medication and denies myalgias. Her cholesterol is at goal. The cholesterol last visit was:   Lab Results  Component Value Date   CHOL 171 11/13/2013   HDL 49 11/13/2013   LDLCALC 50 11/13/2013   TRIG 359* 11/13/2013   CHOLHDL 3.5 11/13/2013     She has been working on diet and exercise for prediabetes, and denies foot ulcerations and paresthesia of the feet. Last A1C in the office was:  Lab Results  Component Value Date   HGBA1C 6.0* 11/13/2013   Patient is on Vitamin D supplement.   Lab Results  Component Value Date   VD25OH 86 11/13/2013       Names of Other Physician/Practitioners you currently use: Patient Care Team: Lucky Cowboy, MD as PCP - General (Internal Medicine) Petra Kuba, MD as Consulting Physician (Gastroenterology) Haskel Khan, MD as Consulting Physician (Ophthalmology) Madilyn Hook, MD as Consulting Physician (Optometry) Arminda Resides, MD as Consulting Physician (Dermatology) Caro Laroche, (Dentist)  Medication Review Current Outpatient Prescriptions on File Prior  to Visit  Medication Sig Dispense Refill  . acyclovir (ZOVIRAX) 400 MG tablet  Take 400 mg by mouth 2 (two) times daily.      Marland Kitchen ALPRAZolam (XANAX) 0.5 MG tablet Take 1 tablet (0.5 mg total) by mouth at bedtime as needed for anxiety.  90 tablet  1  . aspirin 81 MG tablet Take 81 mg by mouth every other day.      . bisoprolol-hydrochlorothiazide (ZIAC) 5-6.25 MG per tablet TAKE ONE TABLET BY MOUTH ONCE DAILY  90 tablet  1  . cetirizine (ZYRTEC) 10 MG tablet Take 10 mg by mouth daily.      . Cholecalciferol (VITAMIN D PO) Take 2,000 Units by mouth 3 (three) times daily.      . Coenzyme Q10 (COQ10 PO) Take 1 capsule by mouth daily.      . Flaxseed, Linseed, (FLAXSEED OIL PO) Take 1 capsule by mouth daily.      . furosemide (LASIX) 40 MG tablet Take 1 tablet (40 mg total) by mouth daily.  90 tablet  3  . KRILL OIL PO Take 1 capsule by mouth daily.      Marland Kitchen LORazepam (ATIVAN) 2 MG tablet TAKE ONE-HALF TO ONE TABLET BY MOUTH AT BEDTIME AS NEEDED FOR SLEEP  30 tablet  2  . Multiple Vitamins-Minerals (MULTIVITAMIN PO) Take by mouth 2 (two) times daily.      Marland Kitchen OVER THE COUNTER MEDICATION Calcium 2 tabs per day      . potassium chloride (K-DUR,KLOR-CON) 10 MEQ tablet Take 10 mEq by mouth 2 (two) times daily.      . pravastatin (PRAVACHOL) 40 MG tablet Take 1 tablet (40 mg total) by mouth daily.  90 tablet  1   No current facility-administered medications on file prior to visit.   Allergies  Allergen Reactions  . Ppd [Tuberculin Purified Protein Derivative]    Past Medical History  Diagnosis Date  . Hypertension   . Hyperlipidemia   . Depression   . Allergy   . Anxiety   . Arthritis   . Osteopenia   . Vitamin D deficiency   . Prediabetes   . Acute iritis of left eye     secondary to HSV   Past Surgical History  Procedure Laterality Date  . Tonsillectomy and adenoidectomy    . Eye surgery Left 11/2012    Cataract- Groat   History  Substance Use Topics  . Smoking status: Never Smoker   . Smokeless tobacco: Not on file  . Alcohol Use: Not on file   Family History   Problem Relation Age of Onset  . Hyperlipidemia Mother   . Heart disease Mother   . Hypertension Father   . Diabetes Father      Current Problems (verified) Patient Active Problem List   Diagnosis Date Noted  . Hypertension   . Hyperlipidemia   . Vitamin D deficiency   . Prediabetes     Screening Tests Health Maintenance  Topic Date Due  . Tetanus/tdap  07/17/1964  . Mammogram  07/18/1995  . Colonoscopy  07/18/1995  . Zostavax  07/17/2005  . Pneumococcal Polysaccharide Vaccine Age 76 And Over  07/18/2010  . Influenza Vaccine  10/15/2014    Immunization History  Administered Date(s) Administered  . Influenza, High Dose Seasonal PF 11/13/2013    Preventative care: Last colonoscopy: 09/2013 WNL due 2020 Last mammogram: 09/2013 WNL [facility] Last pap smear/pelvic exam: 2012 WNL due 2017 DEXA:09/2012 Osteopenia EYE: 09/2013 Cornea stable Q 59month DENTIST:  Q 6 M due 12/2013  Prior vaccinations: TD: 2006  Influenza: 12/2012 Pneumococcal: 12/2010 Shingles/Zostavax: 2012  History reviewed: allergies, current medications, past family history, past medical history, past social history, past surgical history and problem list  Risk Factors: Osteoporosis: postmenopausal estrogen deficiency History of fracture in the past year: no  Tobacco History  Substance Use Topics  . Smoking status: Never Smoker   . Smokeless tobacco: Not on file  . Alcohol Use: Not on file   She does not smoke.  Patient is not a former smoker. Are there smokers in your home (other than you)?  No  Alcohol Current alcohol use: none  Caffeine Current caffeine use: coffee 1 /day  Exercise  Current exercise: housecleaning, no regular exercise, walking and yard work  Nutrition/Diet Current diet: in general, a "healthy" diet    Cardiac risk factors: dyslipidemia, hypertension and sedentary lifestyle.  Depression Screen (Note: if answer to either of the following is "Yes", a more complete  depression screening is indicated)   Q1: Over the past two weeks, have you felt down, depressed or hopeless? Yes  Q2: Over the past two weeks, have you felt little interest or pleasure in doing things? Yes  Have you lost interest or pleasure in daily life? No  Do you often feel hopeless? Yes  Do you cry easily over simple problems? Yes  Activities of Daily Living In your present state of health, do you have any difficulty performing the following activities?:  Driving? No Managing money?  No Feeding yourself? No Getting from bed to chair? No Climbing a flight of stairs? No Preparing food and eating?: No Bathing or showering? No Getting dressed: No Getting to the toilet? No Using the toilet:No Moving around from place to place: No In the past year have you fallen or had a near fall?:No   Are you sexually active?  No  Do you have more than one partner?  No  Vision Difficulties: Yes with close monitoring  Hearing Difficulties: No Do you often ask people to speak up or repeat themselves? No Do you experience ringing or noises in your ears? No Do you have difficulty understanding soft or whispered voices? No  Cognition  Do you feel that you have a problem with memory?No  Do you often misplace items? No  Do you feel safe at home?  Yes  Advanced directives Does patient have a Health Care Power of Attorney? Yes Does patient have a Living Will? Yes   Objective:     Blood pressure 118/70, pulse 68, temperature 98 F (36.7 C), temperature source Temporal, resp. rate 18, height 5' 3.5" (1.613 m), weight 170 lb (77.111 kg). Body mass index is 29.64 kg/(m^2).  General appearance: alert, no distress, WD/WN,  female Cognitive Testing  Alert? Yes  Normal Appearance?Yes  Oriented to person? Yes  Place? Yes   Time? Yes  Recall of three objects?  Yes  Can perform simple calculations? Yes  Displays appropriate judgment?Yes  Can read the correct time from a watch face?Yes  HEENT:  normocephalic, sclerae anicteric, TMs pearly, nares patent, no discharge or erythema, pharynx normal Oral cavity: MMM, no lesions Neck: supple, no lymphadenopathy, no thyromegaly, no masses Heart: RRR, normal S1, S2, no murmurs Lungs: CTA bilaterally, no wheezes, rhonchi, or rales Abdomen: +bs, soft, non tender, non distended, no masses, no hepatomegaly, no splenomegaly Musculoskeletal: nontender, no swelling, no obvious deformity Skin: WNL, full at [facility], scattered seborrheic keratosis Extremities: no edema, no cyanosis, no clubbing Pulses:  2+ symmetric, upper and lower extremities, normal cap refill Neurological: alert, oriented x 3, CN2-12 intact, strength normal upper extremities and lower extremities, sensation normal throughout, DTRs 2+ throughout, no cerebellar signs, gait normal Psychiatric: normal affect, behavior normal, pleasant  Breast: nontender, no masses or lumps, no skin changes, no nipple discharge or inversion, no axillary lymphadenopathy Gyn: defer  Rectal: defer  AORTA SCAN WNL EKG NSCSPT   Medicare Attestation I have personally reviewed: The patient's medical and social history Their use of alcohol, tobacco or illicit drugs Their current medications and supplements The patient's functional ability including ADLs,fall risks, home safety risks, cognitive, and hearing and visual impairment Diet and physical activities Evidence for depression or mood disorders  The patient's weight, height, BMI, and visual acuity have been recorded in the chart.  I have made referrals, counseling, and provided education to the patient based on review of the above and I have provided the patient with a written personalized care plan for preventive services.     Loree Fee, R, PA-C   11/16/2013

## 2013-11-14 LAB — BASIC METABOLIC PANEL WITH GFR
BUN: 15 mg/dL (ref 6–23)
CO2: 25 meq/L (ref 19–32)
CREATININE: 0.88 mg/dL (ref 0.50–1.10)
Calcium: 10 mg/dL (ref 8.4–10.5)
Chloride: 97 mEq/L (ref 96–112)
GFR, Est African American: 78 mL/min
GFR, Est Non African American: 68 mL/min
Glucose, Bld: 91 mg/dL (ref 70–99)
Potassium: 3.7 mEq/L (ref 3.5–5.3)
SODIUM: 137 meq/L (ref 135–145)

## 2013-11-14 LAB — HEPATIC FUNCTION PANEL
ALT: 17 U/L (ref 0–35)
AST: 22 U/L (ref 0–37)
Albumin: 4.3 g/dL (ref 3.5–5.2)
Alkaline Phosphatase: 83 U/L (ref 39–117)
Bilirubin, Direct: 0.1 mg/dL (ref 0.0–0.3)
Indirect Bilirubin: 0.6 mg/dL (ref 0.2–1.2)
TOTAL PROTEIN: 7.8 g/dL (ref 6.0–8.3)
Total Bilirubin: 0.7 mg/dL (ref 0.2–1.2)

## 2013-11-14 LAB — TSH: TSH: 1.756 u[IU]/mL (ref 0.350–4.500)

## 2013-11-14 LAB — URINALYSIS, ROUTINE W REFLEX MICROSCOPIC
Bilirubin Urine: NEGATIVE
Glucose, UA: NEGATIVE mg/dL
HGB URINE DIPSTICK: NEGATIVE
Ketones, ur: NEGATIVE mg/dL
NITRITE: NEGATIVE
Protein, ur: NEGATIVE mg/dL
Specific Gravity, Urine: 1.006 (ref 1.005–1.030)
Urobilinogen, UA: 0.2 mg/dL (ref 0.0–1.0)
pH: 5.5 (ref 5.0–8.0)

## 2013-11-14 LAB — URINALYSIS, MICROSCOPIC ONLY
BACTERIA UA: NONE SEEN
Casts: NONE SEEN
Crystals: NONE SEEN
Squamous Epithelial / LPF: NONE SEEN

## 2013-11-14 LAB — LIPID PANEL
Cholesterol: 171 mg/dL (ref 0–200)
HDL: 49 mg/dL (ref >39)
LDL Cholesterol: 50 mg/dL (ref 0–99)
TRIGLYCERIDES: 359 mg/dL — AB (ref <150)
Total CHOL/HDL Ratio: 3.5 Ratio
VLDL: 72 mg/dL — ABNORMAL HIGH (ref 0–40)

## 2013-11-14 LAB — VITAMIN D 25 HYDROXY (VIT D DEFICIENCY, FRACTURES): Vit D, 25-Hydroxy: 86 ng/mL (ref 30–89)

## 2013-11-14 LAB — HEMOGLOBIN A1C
HEMOGLOBIN A1C: 6 % — AB (ref <5.7)
MEAN PLASMA GLUCOSE: 126 mg/dL — AB (ref <117)

## 2013-11-14 LAB — MICROALBUMIN / CREATININE URINE RATIO
Creatinine, Urine: 26.9 mg/dL
MICROALB UR: 0.5 mg/dL (ref 0.00–1.89)
Microalb Creat Ratio: 18.6 mg/g (ref 0.0–30.0)

## 2013-11-14 LAB — MAGNESIUM: MAGNESIUM: 2.1 mg/dL (ref 1.5–2.5)

## 2013-11-14 LAB — INSULIN, FASTING: INSULIN FASTING, SERUM: 9.4 u[IU]/mL (ref 2.0–19.6)

## 2013-11-16 ENCOUNTER — Encounter: Payer: Self-pay | Admitting: Emergency Medicine

## 2013-11-18 LAB — URINE CULTURE: Colony Count: >=100000

## 2013-11-20 ENCOUNTER — Other Ambulatory Visit: Payer: Self-pay | Admitting: Emergency Medicine

## 2013-11-20 MED ORDER — NITROFURANTOIN MONOHYD MACRO 100 MG PO CAPS: 100.0000 mg | ORAL_CAPSULE | Freq: Two times a day (BID) | ORAL | Status: AC

## 2013-11-24 ENCOUNTER — Telehealth: Payer: Self-pay | Admitting: *Deleted

## 2013-11-24 MED ORDER — FLUCONAZOLE 150 MG PO TABS: 150.0000 mg | ORAL_TABLET | ORAL | Status: DC

## 2013-11-24 NOTE — Telephone Encounter (Signed)
Patient called and states she has a vaginal yeast infection related to her recent round of antibiotics.  Per Dr Oneta Rack, OK to call in RX for Diflucan 150 mg.

## 2013-12-05 ENCOUNTER — Telehealth: Payer: Self-pay | Admitting: *Deleted

## 2013-12-05 MED ORDER — AMOXICILLIN 500 MG PO TABS: 500.0000 mg | ORAL_TABLET | Freq: Three times a day (TID) | ORAL | Status: DC

## 2013-12-05 NOTE — Telephone Encounter (Signed)
Patient called with concerns of UTI symptoms still presents with no relief after completing Macrobid abx.  Patient states she is drinking more water but urine still strong in color and odor.  Per Dr. Kathryne Sharper orders, abx for Amoxicillin 500 mg TID #42 sent into pharmacy for patient and patient will f/u 12/27/13 for recheck UA, C&S.

## 2013-12-27 ENCOUNTER — Ambulatory Visit (INDEPENDENT_AMBULATORY_CARE_PROVIDER_SITE_OTHER): Payer: Medicare Other | Admitting: *Deleted

## 2013-12-27 DIAGNOSIS — N390 Urinary tract infection, site not specified: Secondary | ICD-10-CM

## 2013-12-27 NOTE — Progress Notes (Signed)
Patient ID: Allison Barker, female   DOB: 03/17/1945, 68 y.o.   MRN: [id number] Patient presents for 1 month recheck  UA,C&S.  Patient completed abx AD and denies any current UTI symptoms.

## 2013-12-28 LAB — URINALYSIS, MICROSCOPIC ONLY
BACTERIA UA: NONE SEEN
Squamous Epithelial / LPF: NONE SEEN

## 2013-12-28 LAB — URINALYSIS, ROUTINE W REFLEX MICROSCOPIC
Bilirubin Urine: NEGATIVE
Glucose, UA: NEGATIVE mg/dL
HGB URINE DIPSTICK: NEGATIVE
Ketones, ur: NEGATIVE mg/dL
NITRITE: NEGATIVE
Protein, ur: NEGATIVE mg/dL
SPECIFIC GRAVITY, URINE: 1.012 (ref 1.005–1.030)
Urobilinogen, UA: 0.2 mg/dL (ref 0.0–1.0)
pH: 6 (ref 5.0–8.0)

## 2013-12-29 LAB — URINE CULTURE: Colony Count: >=100000

## 2014-01-15 ENCOUNTER — Other Ambulatory Visit: Payer: Self-pay | Admitting: Physician Assistant

## 2014-02-03 ENCOUNTER — Encounter: Payer: Self-pay | Admitting: *Deleted

## 2014-02-21 ENCOUNTER — Ambulatory Visit: Payer: Self-pay | Admitting: Physician Assistant

## 2014-03-21 ENCOUNTER — Encounter: Payer: Self-pay | Admitting: Physician Assistant

## 2014-03-21 ENCOUNTER — Ambulatory Visit (INDEPENDENT_AMBULATORY_CARE_PROVIDER_SITE_OTHER): Payer: Medicare Other | Admitting: Physician Assistant

## 2014-03-21 VITALS — BP 120/68 | HR 72 | Temp 97.7°F | Resp 16 | Ht 63.5 in | Wt 176.0 lb

## 2014-03-21 DIAGNOSIS — E669 Obesity, unspecified: Secondary | ICD-10-CM

## 2014-03-21 DIAGNOSIS — N390 Urinary tract infection, site not specified: Secondary | ICD-10-CM | POA: Insufficient documentation

## 2014-03-21 DIAGNOSIS — I10 Essential (primary) hypertension: Secondary | ICD-10-CM

## 2014-03-21 DIAGNOSIS — Z79899 Other long term (current) drug therapy: Secondary | ICD-10-CM

## 2014-03-21 DIAGNOSIS — R7309 Other abnormal glucose: Secondary | ICD-10-CM

## 2014-03-21 DIAGNOSIS — E785 Hyperlipidemia, unspecified: Secondary | ICD-10-CM

## 2014-03-21 DIAGNOSIS — E559 Vitamin D deficiency, unspecified: Secondary | ICD-10-CM

## 2014-03-21 DIAGNOSIS — R7303 Prediabetes: Secondary | ICD-10-CM

## 2014-03-21 LAB — CBC WITH DIFFERENTIAL/PLATELET
BASOS PCT: 1 % (ref 0–1)
Basophils Absolute: 0.1 10*3/uL (ref 0.0–0.1)
EOS PCT: 2 % (ref 0–5)
Eosinophils Absolute: 0.1 10*3/uL (ref 0.0–0.7)
HEMATOCRIT: 45.4 % (ref 36.0–46.0)
Hemoglobin: 16 g/dL — ABNORMAL HIGH (ref 12.0–15.0)
LYMPHS ABS: 2.1 10*3/uL (ref 0.7–4.0)
LYMPHS PCT: 31 % (ref 12–46)
MCH: 33 pg (ref 26.0–34.0)
MCHC: 35.2 g/dL (ref 30.0–36.0)
MCV: 93.6 fL (ref 78.0–100.0)
MPV: 9.2 fL (ref 8.6–12.4)
Monocytes Absolute: 0.7 10*3/uL (ref 0.1–1.0)
Monocytes Relative: 10 % (ref 3–12)
Neutro Abs: 3.8 10*3/uL (ref 1.7–7.7)
Neutrophils Relative %: 56 % (ref 43–77)
Platelets: 339 10*3/uL (ref 150–400)
RBC: 4.85 MIL/uL (ref 3.87–5.11)
RDW: 13.2 % (ref 11.5–15.5)
WBC: 6.8 10*3/uL (ref 4.0–10.5)

## 2014-03-21 NOTE — Progress Notes (Signed)
Assessment and Plan:  Hypertension: Continue medication, monitor blood pressure at home. Continue DASH diet.  Reminder to go to the ER if any CP, SOB, nausea, dizziness, severe HA, changes vision/speech, left arm numbness and tingling, and jaw pain. Cholesterol: Continue diet and exercise. Check cholesterol.  Pre-diabetes-Continue diet and exercise. Check A1C Vitamin D Def- check level and continue medications.  Obesity with co morbidities- long discussion about weight loss, diet, and exercise Urinary tract infection: Maintain adequate hydration. Follow up if symptoms not improving, and as needed. check labs.    Continue diet and meds as discussed. Further disposition pending results of labs.  HPI 69 y.o. female  presents for 3 month follow up with hypertension, hyperlipidemia, prediabetes and vitamin D. Her blood pressure has been controlled at home, today their BP is BP: 120/68 mmHg  She states the holidays were difficult, lost son and had to put dog down.  She does not workout. She denies chest pain, shortness of breath, dizziness.  She is on cholesterol medication, pravastatin 40mg  and denies myalgias. Her cholesterol is at goal. The cholesterol last visit was:   Lab Results  Component Value Date   CHOL 171 11/13/2013   HDL 49 11/13/2013   LDLCALC 50 11/13/2013   TRIG 359* 11/13/2013   CHOLHDL 3.5 11/13/2013  She has been working on diet and exercise for prediabetes, and denies paresthesia of the feet, polydipsia, polyuria and visual disturbances. Last A1C in the office was:  Lab Results  Component Value Date   HGBA1C 6.0* 11/13/2013  Has history of recurrent UTI's, no symptoms at this time but she would like to check her urine.  Patient is on Vitamin D supplement.   Lab Results  Component Value Date   VD25OH 86 11/13/2013  BMI is Body mass index is 30.68 kg/(m^2)., she is working on diet and exercise. Wt Readings from Last 3 Encounters:  03/21/14 176 lb (79.833 kg)   11/13/13 170 lb (77.111 kg)  08/17/13 174 lb 12.8 oz (79.289 kg)     Current Medications:  Current Outpatient Prescriptions on File Prior to Visit  Medication Sig Dispense Refill  . acyclovir (ZOVIRAX) 400 MG tablet Take 400 mg by mouth 2 (two) times daily.    Marland Kitchen. ALPRAZolam (XANAX) 0.5 MG tablet Take 1 tablet (0.5 mg total) by mouth at bedtime as needed for anxiety. 90 tablet 1  . amoxicillin (AMOXIL) 500 MG tablet Take 1 tablet (500 mg total) by mouth 3 (three) times daily. 42 tablet 0  . aspirin 81 MG tablet Take 81 mg by mouth every other day.    . bisoprolol-hydrochlorothiazide (ZIAC) 5-6.25 MG per tablet TAKE ONE TABLET BY MOUTH ONCE DAILY 90 tablet 1  . cetirizine (ZYRTEC) 10 MG tablet Take 10 mg by mouth daily.    . Cholecalciferol (VITAMIN D PO) Take 2,000 Units by mouth 3 (three) times daily.    . citalopram (CELEXA) 40 MG tablet Take 1 tablet (40 mg total) by mouth daily. 30 tablet 2  . Coenzyme Q10 (COQ10 PO) Take 1 capsule by mouth daily.    . Flaxseed, Linseed, (FLAXSEED OIL PO) Take 1 capsule by mouth daily.    . fluconazole (DIFLUCAN) 150 MG tablet Take 1 tablet (150 mg total) by mouth once a week. 4 tablet 0  . furosemide (LASIX) 40 MG tablet TAKE 1 TABLET BY MOUTH ONCE DAILY 90 tablet 2  . KRILL OIL PO Take 1 capsule by mouth daily.    Marland Kitchen. LORazepam (ATIVAN) 2 MG  tablet TAKE ONE-HALF TO ONE TABLET BY MOUTH AT BEDTIME AS NEEDED FOR SLEEP 30 tablet 2  . Multiple Vitamins-Minerals (MULTIVITAMIN PO) Take by mouth 2 (two) times daily.    Marland Kitchen OVER THE COUNTER MEDICATION Calcium 2 tabs per day    . potassium chloride (K-DUR,KLOR-CON) 10 MEQ tablet Take 10 mEq by mouth 2 (two) times daily.    . pravastatin (PRAVACHOL) 40 MG tablet Take 1 tablet (40 mg total) by mouth daily. 90 tablet 1   No current facility-administered medications on file prior to visit.   Medical History:  Past Medical History  Diagnosis Date  . Hypertension   . Hyperlipidemia   . Depression   . Allergy    . Anxiety   . Arthritis   . Osteopenia   . Vitamin D deficiency   . Prediabetes   . 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 Ppd [Tuberculin Purified Protein Derivative]     Review of Systems:  Review of Systems  Constitutional: Negative.   HENT: Negative.   Respiratory: Negative.   Cardiovascular: Negative.   Genitourinary: Negative.   Musculoskeletal: Negative.   Skin: Negative.   Neurological: Negative.   Psychiatric/Behavioral: Negative.     Family history- Review and unchanged Social history- Review and unchanged Physical Exam: BP 120/68 mmHg  Pulse 72  Temp(Src) 97.7 F (36.5 C)  Resp 16  Ht 5' 3.5" (1.613 m)  Wt 176 lb (79.833 kg)  BMI 30.68 kg/m2 Wt Readings from Last 3 Encounters:  03/21/14 176 lb (79.833 kg)  11/13/13 170 lb (77.111 kg)  08/17/13 174 lb 12.8 oz (79.289 kg)   General Appearance: Well nourished, in no apparent distress. Eyes: PERRLA, EOMs, conjunctiva no swelling or erythema Sinuses: No Frontal/maxillary tenderness ENT/Mouth: Ext aud canals clear, TMs without erythema, bulging. No erythema, swelling, or exudate on post pharynx.  Tonsils not swollen or erythematous. Hearing normal.  Neck: Supple, thyroid normal.  Respiratory: Respiratory effort normal, BS equal bilaterally without rales, rhonchi, wheezing or stridor.  Cardio: RRR with no MRGs. Brisk peripheral pulses without edema.  Abdomen: Soft, + BS.  Non tender, no guarding, rebound, hernias, masses. Lymphatics: Non tender without lymphadenopathy.  Musculoskeletal: Full ROM, 5/5 strength, normal gait.  Skin: Warm, dry without rashes, lesions, ecchymosis.  Neuro: Cranial nerves intact. Normal muscle tone, no cerebellar symptoms. Sensation intact.  Psych: Awake and oriented X 3, normal affect, Insight and Judgment appropriate.    Quentin Mulling, PA-C 11:31 AM [facility]

## 2014-03-21 NOTE — Patient Instructions (Signed)
Recommendations For Diabetic/Prediabetic Patients:   -  Take medications as prescribed  -  Recommend Dr Francis DowseJoel Fuhrman's book "The End of Diabetes "  And "The End of Dieting"- Can get at  www.Amazon.com and encourage also get the Audio CD book  - AVOID Animal products, ie. Meat - red/white, Poultry and Dairy/especially cheese - Exercise at least 5 times a week for 30 minutes or preferably daily.  - No Smoking - Drink less than 2 drinks a day.  - Monitor your feet for sores - Have yearly Eye Exams - Recommend annual Flu vaccine  - Recommend Pneumovax and Prevnar vaccines - Shingles Vaccine (Zostavax) if over 69 y.o.  Goals:   - BMI less than 24 - Fasting sugar less than 130 or less than 150 if tapering medicines to lose weight  - Systolic BP less than 130  - Diastolic BP less than 80 - Bad LDL Cholesterol less than 70 - Triglycerides less than 150 We want weight loss that will last so you should lose 1-2 pounds a week.  THAT IS IT! Please pick THREE things a month to change. Once it is a habit check off the item. Then pick another three items off the list to become habits.  If you are already doing a habit on the list GREAT!  Cross that item off! o Don't drink your calories. Ie, alcohol, soda, fruit juice, and sweet tea.  o Drink more water. Drink a glass when you feel hungry or before each meal.  o Eat breakfast - Complex carb and protein (likeDannon light and fit yogurt, oatmeal, fruit, eggs, [place]key bacon). o Measure your cereal.  Eat no more than one cup a day. (ie [place]ashi) o Eat an apple a day. o Add a vegetable a day. o Try a new vegetable a month. o Use Pam! Stop using oil or butter to cook. o Don't finish your plate or use smaller plates. o Share your dessert. o Eat sugar free Jello for dessert or frozen grapes. o Don't eat 2-3 hours before bed. o Switch to whole wheat bread, pasta, and brown rice. o Make healthier choices when you eat out. No fries! o Pick baked chicken, NOT  fried. o Don't forget to SLOW DOWN when you eat. It is not going anywhere.  o Take the stairs. o Park far away in the parking lot o [employer] Farm[employer] soup cans (or weights) for 10 minutes while watching TV. o Walk at work for 10 minutes during break. o Walk outside 1 time a week with your friend, kids, dog, or significant other. o Start a walking group at church. o Walk the mall as much as you can tolerate.  o Keep a food diary. o Weigh yourself daily. o Walk for 15 minutes 3 days per week. o Cook at home more often and eat out less.  If life happens and you go back to old habits, it is okay.  Just start over. You can do it!   If you experience chest pain, get short of breath, or tired during the exercise, please stop immediately and inform your doctor.     Bad carbs also include fruit juice, alcohol, and sweet tea. These are empty calories that do not signal to your brain that you are full.   Please remember the good carbs are still carbs which convert into sugar. So please measure them out no more than 1/2-1 cup of rice, oatmeal, pasta, and beans  Veggies are however free foods! Pile them  on.   Not all fruit is created equal. Please see the list below, the fruit at the bottom is higher in sugars than the fruit at the top. Please avoid all dried fruits.

## 2014-03-22 LAB — URINALYSIS, ROUTINE W REFLEX MICROSCOPIC
Bilirubin Urine: NEGATIVE
GLUCOSE, UA: NEGATIVE mg/dL
HGB URINE DIPSTICK: NEGATIVE
Ketones, ur: NEGATIVE mg/dL
Nitrite: NEGATIVE
PROTEIN: NEGATIVE mg/dL
Specific Gravity, Urine: 1.006 (ref 1.005–1.030)
UROBILINOGEN UA: 0.2 mg/dL (ref 0.0–1.0)
pH: 6.5 (ref 5.0–8.0)

## 2014-03-22 LAB — BASIC METABOLIC PANEL WITH GFR
BUN: 15 mg/dL (ref 6–23)
CALCIUM: 9.5 mg/dL (ref 8.4–10.5)
CO2: 28 mEq/L (ref 19–32)
CREATININE: 0.89 mg/dL (ref 0.50–1.10)
Chloride: 100 mEq/L (ref 96–112)
GFR, EST NON AFRICAN AMERICAN: 67 mL/min
GFR, Est African American: 77 mL/min
Glucose, Bld: 109 mg/dL — ABNORMAL HIGH (ref 70–99)
Potassium: 3.5 mEq/L (ref 3.5–5.3)
Sodium: 136 mEq/L (ref 135–145)

## 2014-03-22 LAB — URINALYSIS, MICROSCOPIC ONLY
CRYSTALS: NONE SEEN
Casts: NONE SEEN
SQUAMOUS EPITHELIAL / LPF: NONE SEEN

## 2014-03-22 LAB — LIPID PANEL
CHOL/HDL RATIO: 3.6 ratio
Cholesterol: 179 mg/dL (ref 0–200)
HDL: 50 mg/dL (ref >39)
LDL Cholesterol: 55 mg/dL (ref 0–99)
TRIGLYCERIDES: 368 mg/dL — AB (ref <150)
VLDL: 74 mg/dL — AB (ref 0–40)

## 2014-03-22 LAB — HEMOGLOBIN A1C
Hgb A1c MFr Bld: 5.9 % — ABNORMAL HIGH (ref <5.7)
MEAN PLASMA GLUCOSE: 123 mg/dL — AB (ref <117)

## 2014-03-22 LAB — HEPATIC FUNCTION PANEL
ALT: 20 U/L (ref 0–35)
AST: 24 U/L (ref 0–37)
Albumin: 4 g/dL (ref 3.5–5.2)
Alkaline Phosphatase: 84 U/L (ref 39–117)
BILIRUBIN DIRECT: 0.1 mg/dL (ref 0.0–0.3)
BILIRUBIN INDIRECT: 0.4 mg/dL (ref 0.2–1.2)
BILIRUBIN TOTAL: 0.5 mg/dL (ref 0.2–1.2)
TOTAL PROTEIN: 7.3 g/dL (ref 6.0–8.3)

## 2014-03-22 LAB — MAGNESIUM: MAGNESIUM: 2 mg/dL (ref 1.5–2.5)

## 2014-03-22 LAB — INSULIN, FASTING: Insulin fasting, serum: 23.8 u[IU]/mL — ABNORMAL HIGH (ref 2.0–19.6)

## 2014-03-22 LAB — VITAMIN D 25 HYDROXY (VIT D DEFICIENCY, FRACTURES): VIT D 25 HYDROXY: 44 ng/mL (ref 30–100)

## 2014-03-22 LAB — TSH: TSH: 2.051 u[IU]/mL (ref 0.350–4.500)

## 2014-03-23 LAB — URINE CULTURE

## 2014-03-24 ENCOUNTER — Encounter: Payer: Self-pay | Admitting: *Deleted

## 2014-03-27 ENCOUNTER — Telehealth: Payer: Self-pay | Admitting: *Deleted

## 2014-03-27 NOTE — Telephone Encounter (Signed)
Patient requested the Prevnar-13 be called to [facility].  [facility] called in per Dr Oneta RackMcKeown and patient will call back with the administration date.

## 2014-04-18 ENCOUNTER — Other Ambulatory Visit: Payer: Self-pay | Admitting: Internal Medicine

## 2014-04-21 ENCOUNTER — Other Ambulatory Visit: Payer: Self-pay | Admitting: Internal Medicine

## 2014-06-13 ENCOUNTER — Other Ambulatory Visit: Payer: Self-pay | Admitting: Internal Medicine

## 2014-06-13 ENCOUNTER — Other Ambulatory Visit: Payer: Self-pay | Admitting: Physician Assistant

## 2014-06-13 DIAGNOSIS — G4700 Insomnia, unspecified: Secondary | ICD-10-CM

## 2014-06-30 ENCOUNTER — Other Ambulatory Visit: Payer: Self-pay | Admitting: Physician Assistant

## 2014-07-30 ENCOUNTER — Ambulatory Visit: Payer: Self-pay | Admitting: Internal Medicine

## 2014-10-03 ENCOUNTER — Encounter: Payer: Self-pay | Admitting: Internal Medicine

## 2014-10-03 ENCOUNTER — Other Ambulatory Visit: Payer: Self-pay | Admitting: *Deleted

## 2014-10-03 ENCOUNTER — Encounter: Payer: Self-pay | Admitting: Physician Assistant

## 2014-10-03 ENCOUNTER — Ambulatory Visit (INDEPENDENT_AMBULATORY_CARE_PROVIDER_SITE_OTHER): Payer: Medicare Other | Admitting: Internal Medicine

## 2014-10-03 VITALS — BP 128/92 | HR 76 | Temp 97.2°F | Resp 16 | Ht 63.5 in | Wt 166.8 lb

## 2014-10-03 DIAGNOSIS — F411 Generalized anxiety disorder: Secondary | ICD-10-CM

## 2014-10-03 DIAGNOSIS — R7303 Prediabetes: Secondary | ICD-10-CM

## 2014-10-03 DIAGNOSIS — Z0000 Encounter for general adult medical examination without abnormal findings: Secondary | ICD-10-CM

## 2014-10-03 DIAGNOSIS — E559 Vitamin D deficiency, unspecified: Secondary | ICD-10-CM

## 2014-10-03 DIAGNOSIS — E785 Hyperlipidemia, unspecified: Secondary | ICD-10-CM

## 2014-10-03 DIAGNOSIS — I10 Essential (primary) hypertension: Secondary | ICD-10-CM

## 2014-10-03 DIAGNOSIS — E669 Obesity, unspecified: Secondary | ICD-10-CM

## 2014-10-03 DIAGNOSIS — R7309 Other abnormal glucose: Secondary | ICD-10-CM

## 2014-10-03 DIAGNOSIS — Z79899 Other long term (current) drug therapy: Secondary | ICD-10-CM

## 2014-10-03 MED ORDER — BISOPROLOL-HYDROCHLOROTHIAZIDE 5-6.25 MG PO TABS: ORAL_TABLET | ORAL | Status: DC

## 2014-10-03 MED ORDER — ALPRAZOLAM 0.5 MG PO TABS: 0.5000 mg | ORAL_TABLET | Freq: Every evening | ORAL | Status: DC | PRN

## 2014-10-03 NOTE — Patient Instructions (Signed)

## 2014-10-03 NOTE — Progress Notes (Signed)
Patient ID: Allison Barker, female   DOB: 11/20/1945, 69 y.o.   MRN: [id number]   This very nice 69 y.o. MWF presents for 3 month follow up with Hypertension, Hyperlipidemia, Pre-Diabetes and Vitamin D Deficiency. Patient relates that she is still grieving the loss of her son ~ 1&1/2 year ago.    Patient is treated for HTN & BP has been controlled at home. Today's BP: (!) 128/92 mmHg. Patient has had no complaints of any cardiac type chest pain, palpitations, dyspnea/orthopnea/PND, dizziness, claudication, or dependent edema. Walks occas for exercise.    Hyperlipidemia is controlled with diet & meds. Patient denies myalgias or other med SE's. Last Lipids were at goal  - Cholesterol 179; HDL 50; LDL  55; with elevated Triglycerides 368 on 03/21/2014.   Also, the patient has history of PreDiabetes with A1c 5.8% in 2013 and has had no symptoms of reactive hypoglycemia, diabetic polys, paresthesias or visual blurring.  Last A1c was  5.9% on 03/21/2014.    Further, the patient also has history of Vitamin D Deficiency and supplements vitamin D without any suspected side-effects. Last vitamin D was 44 on 03/21/2014.  Medication Sig  . acyclovir  400 MG tablet Take 400 mg by mouth 2 (two) times daily.  Marland Kitchen ALPRAZolam  0.5 MG tablet Take 1 tablet (0.5 mg total) by mouth at bedtime as needed for anxiety.  Marland Kitchen aspirin 81 MG tablet Take 81 mg by mouth every other day.  . bisoprolol-hctz (ZIAC) 5-6.25 MG per tablet Take 1 tablet daily for BP  . cetirizine  10 MG tablet Take 10 mg by mouth daily.  Marland Kitchen VITAMIN D  Take 2,000 Units by mouth 3 (three) times daily.  . citalopram 40 MG tablet Take 1 tablet (40 mg total) by mouth daily.  . Coenzyme Q10  Take 1 capsule by mouth daily.  Marland Kitchen FLAXSEED OIL Take 1 capsule by mouth daily.  . furosemide40 MG tablet TAKE 1 TABLET BY MOUTH ONCE DAILY  . KRILL OIL Take 1 capsule by mouth daily.  Marland Kitchen LORazepam  2 MG tablet TAKE 1/2 TO 1 TABLET BY MOUTH AT BEDTIME.  . Multiple  Vitamins-Minerals  Take by mouth 2 (two) times daily.  . Calcium  2 tabs per day  . pravastatin 40 MG tablet Take 1 tablet (40 mg total) by mouth daily.  . potassium chloride KLOR-CON 10 MEQ  Take 10 mEq by mouth 2 (two) times daily.   Allergies  Allergen Reactions  . Ppd [Tuberculin Purified Protein Derivative]    PMHx:   Past Medical History  Diagnosis Date  . Hypertension   . Hyperlipidemia   . Depression   . Allergy   . Anxiety   . Arthritis   . Osteopenia   . Vitamin D deficiency   . Prediabetes   . Acute iritis of left eye     secondary to HSV   Immunization History  Administered Date(s) Administered  . Hepatitis B 03/16/2001, 09/22/2001  . Influenza, High Dose Seasonal PF 11/13/2013  . Pneumococcal Polysaccharide-23 12/25/2010  . Td 06/17/2004  . Zoster 07/08/2010   Past Surgical History  Procedure Laterality Date  . Tonsillectomy and adenoidectomy    . Eye surgery Left 11/2012    Cataract- Groat   FHx:    Reviewed / unchanged  SHx:    Reviewed / unchanged  Systems Review:  Constitutional: Denies fever, chills, wt changes, headaches, insomnia, fatigue, night sweats, change in appetite. Eyes: Denies redness, blurred vision, diplopia, discharge,  itchy, watery eyes.  ENT: Denies discharge, congestion, post nasal drip, epistaxis, sore throat, earache, hearing loss, dental pain, tinnitus, vertigo, sinus pain, snoring.  CV: Denies chest pain, palpitations, irregular heartbeat, syncope, dyspnea, diaphoresis, orthopnea, PND, claudication or edema. Respiratory: denies cough, dyspnea, DOE, pleurisy, hoarseness, laryngitis, wheezing.  Gastrointestinal: Denies dysphagia, odynophagia, heartburn, reflux, water brash, abdominal pain or cramps, nausea, vomiting, bloating, diarrhea, constipation, hematemesis, melena, hematochezia  or hemorrhoids. Genitourinary: Denies dysuria, frequency, urgency, nocturia, hesitancy, discharge, hematuria or flank pain. Musculoskeletal: Denies  arthralgias, myalgias, stiffness, jt. swelling, pain, limping or strain/sprain.  Skin: Denies pruritus, rash, hives, warts, acne, eczema or change in skin lesion(s). Neuro: No weakness, tremor, incoordination, spasms, paresthesia or pain. Psychiatric: Denies confusion, memory loss or sensory loss. Endo: Denies change in weight, skin or hair change.  Heme/Lymph: No excessive bleeding, bruising or enlarged lymph nodes.  Physical Exam  BP 128/92   Pulse 76  Temp 97.2 F   Resp 16  Ht 5' 3.5"   Wt 166 lb 12.8 oz    BMI 29.08   Appears well nourished and in no distress. Eyes: PERRLA, EOMs, conjunctiva no swelling or erythema. Sinuses: No frontal/maxillary tenderness ENT/Mouth: EAC's clear, TM's nl w/o erythema, bulging. Nares clear w/o erythema, swelling, exudates. Oropharynx clear without erythema or exudates. Oral hygiene is good. Tongue normal, non obstructing. Hearing intact.  Neck: Supple. Thyroid nl. Car 2+/2+ without bruits, nodes or JVD. Chest: Respirations nl with BS clear & equal w/o rales, rhonchi, wheezing or stridor.  Cor: Heart sounds normal w/ regular rate and rhythm without sig. murmurs, gallops, clicks, or rubs. Peripheral pulses normal and equal  without edema.  Abdomen: Soft & bowel sounds normal. Non-tender w/o guarding, rebound, hernias, masses, or organomegaly.  Lymphatics: Unremarkable.  Musculoskeletal: Full ROM all peripheral extremities, joint stability, 5/5 strength, and normal gait.  Skin: Warm, dry without exposed rashes, lesions or ecchymosis apparent.  Neuro: Cranial nerves intact, reflexes equal bilaterally. Sensory-motor testing grossly intact. Tendon reflexes grossly intact.  Pysch: Alert & oriented x 3.  Insight and judgement nl & appropriate. No ideations.  Assessment and Plan:  1. Essential hypertension  - TSH  2. Hyperlipidemia  - Lipid panel  3. Prediabetes  - Hemoglobin A1c - Insulin, random  4. Vitamin D deficiency  - Vit D  25  hydroxy   5. Obesity   6. Medication management  - CBC with Differential/Platelet - BASIC METABOLIC PANEL WITH GFR - Hepatic function panel - Magnesium   Recommended regular exercise, BP monitoring, weight control, and discussed med and SE's. Recommended labs to assess and monitor clinical status. Further disposition pending results of labs. Over 30 minutes of exam, counseling, chart review was performed

## 2014-10-08 ENCOUNTER — Other Ambulatory Visit: Payer: Self-pay | Admitting: Internal Medicine

## 2014-10-30 ENCOUNTER — Other Ambulatory Visit: Payer: Self-pay | Admitting: Internal Medicine

## 2014-10-30 ENCOUNTER — Telehealth: Payer: Self-pay | Admitting: *Deleted

## 2014-10-30 NOTE — Telephone Encounter (Signed)
Left a message for patient to call and schedule an appointment for lab only,  to have labs drawn from her 10/03/2014 OV, per Dr Oneta Rack.

## 2014-11-15 ENCOUNTER — Encounter: Payer: Self-pay | Admitting: Internal Medicine

## 2014-11-15 ENCOUNTER — Encounter: Payer: Self-pay | Admitting: Emergency Medicine

## 2015-01-08 ENCOUNTER — Encounter: Payer: Self-pay | Admitting: Internal Medicine

## 2015-01-08 ENCOUNTER — Ambulatory Visit (INDEPENDENT_AMBULATORY_CARE_PROVIDER_SITE_OTHER): Payer: Medicare Other | Admitting: Internal Medicine

## 2015-01-08 VITALS — BP 124/82 | HR 68 | Temp 98.2°F | Resp 18 | Ht 63.5 in | Wt 171.0 lb

## 2015-01-08 DIAGNOSIS — Z79899 Other long term (current) drug therapy: Secondary | ICD-10-CM | POA: Diagnosis not present

## 2015-01-08 DIAGNOSIS — R7303 Prediabetes: Secondary | ICD-10-CM | POA: Diagnosis not present

## 2015-01-08 DIAGNOSIS — F329 Major depressive disorder, single episode, unspecified: Secondary | ICD-10-CM

## 2015-01-08 DIAGNOSIS — E669 Obesity, unspecified: Secondary | ICD-10-CM

## 2015-01-08 DIAGNOSIS — F32A Depression, unspecified: Secondary | ICD-10-CM

## 2015-01-08 DIAGNOSIS — G4700 Insomnia, unspecified: Secondary | ICD-10-CM

## 2015-01-08 DIAGNOSIS — E559 Vitamin D deficiency, unspecified: Secondary | ICD-10-CM | POA: Diagnosis not present

## 2015-01-08 DIAGNOSIS — Z23 Encounter for immunization: Secondary | ICD-10-CM | POA: Diagnosis not present

## 2015-01-08 DIAGNOSIS — E785 Hyperlipidemia, unspecified: Secondary | ICD-10-CM

## 2015-01-08 DIAGNOSIS — Z1212 Encounter for screening for malignant neoplasm of rectum: Secondary | ICD-10-CM

## 2015-01-08 DIAGNOSIS — I10 Essential (primary) hypertension: Secondary | ICD-10-CM | POA: Diagnosis not present

## 2015-01-08 LAB — CBC WITH DIFFERENTIAL/PLATELET
BASOS PCT: 1 % (ref 0–1)
Basophils Absolute: 0.1 10*3/uL (ref 0.0–0.1)
EOS ABS: 0.2 10*3/uL (ref 0.0–0.7)
EOS PCT: 3 % (ref 0–5)
HCT: 45.1 % (ref 36.0–46.0)
Hemoglobin: 15.3 g/dL — ABNORMAL HIGH (ref 12.0–15.0)
LYMPHS ABS: 2.3 10*3/uL (ref 0.7–4.0)
Lymphocytes Relative: 36 % (ref 12–46)
MCH: 32 pg (ref 26.0–34.0)
MCHC: 33.9 g/dL (ref 30.0–36.0)
MCV: 94.4 fL (ref 78.0–100.0)
MONO ABS: 0.7 10*3/uL (ref 0.1–1.0)
MONOS PCT: 10 % (ref 3–12)
MPV: 9.6 fL (ref 8.6–12.4)
Neutro Abs: 3.3 10*3/uL (ref 1.7–7.7)
Neutrophils Relative %: 50 % (ref 43–77)
Platelets: 303 10*3/uL (ref 150–400)
RBC: 4.78 MIL/uL (ref 3.87–5.11)
RDW: 12.8 % (ref 11.5–15.5)
WBC: 6.5 10*3/uL (ref 4.0–10.5)

## 2015-01-08 LAB — BASIC METABOLIC PANEL WITH GFR
BUN: 16 mg/dL (ref 7–25)
CALCIUM: 9.4 mg/dL (ref 8.6–10.4)
CHLORIDE: 99 mmol/L (ref 98–110)
CO2: 29 mmol/L (ref 20–31)
Creat: 0.95 mg/dL (ref 0.50–0.99)
GFR, Est African American: 71 mL/min (ref >=60)
GFR, Est Non African American: 61 mL/min (ref >=60)
Glucose, Bld: 107 mg/dL — ABNORMAL HIGH (ref 65–99)
Potassium: 3.8 mmol/L (ref 3.5–5.3)
Sodium: 140 mmol/L (ref 135–146)

## 2015-01-08 LAB — LIPID PANEL
Cholesterol: 214 mg/dL — ABNORMAL HIGH (ref 125–200)
HDL: 42 mg/dL — ABNORMAL LOW (ref >=46)
LDL Cholesterol: 97 mg/dL (ref <130)
TRIGLYCERIDES: 373 mg/dL — AB (ref <150)
Total CHOL/HDL Ratio: 5.1 Ratio — ABNORMAL HIGH (ref <=5.0)
VLDL: 75 mg/dL — ABNORMAL HIGH (ref <30)

## 2015-01-08 LAB — TSH: TSH: 2.054 u[IU]/mL (ref 0.350–4.500)

## 2015-01-08 LAB — HEPATIC FUNCTION PANEL
ALBUMIN: 3.8 g/dL (ref 3.6–5.1)
ALT: 15 U/L (ref 6–29)
AST: 21 U/L (ref 10–35)
Alkaline Phosphatase: 66 U/L (ref 33–130)
BILIRUBIN TOTAL: 0.7 mg/dL (ref 0.2–1.2)
Bilirubin, Direct: 0.1 mg/dL (ref <=0.2)
Indirect Bilirubin: 0.6 mg/dL (ref 0.2–1.2)
Total Protein: 7 g/dL (ref 6.1–8.1)

## 2015-01-08 MED ORDER — ESCITALOPRAM OXALATE 10 MG PO TABS: ORAL_TABLET | ORAL | Status: DC

## 2015-01-08 MED ORDER — LORAZEPAM 2 MG PO TABS: ORAL_TABLET | ORAL | Status: DC

## 2015-01-08 NOTE — Progress Notes (Signed)
Patient ID: Allison Barker, female   DOB: [birth year], 69 y.o.   MRN: [id number]  Complete Physical  Assessment and Plan:   1. Essential hypertension  - TSH - EKG 12-Lead - Urinalysis, Routine w reflex microscopic (not at [facility]) - Microalbumin / creatinine urine ratio  2. Hyperlipidemia  - Lipid panel  3. Prediabetes -diet and exercise - Hemoglobin A1c  4. Vitamin D deficiency -cont Vit D  5. Medication management  - CBC with Differential/Platelet - BASIC METABOLIC PANEL WITH GFR - Hepatic function panel  6. Obesity -cont diet and exercise  7. Screening for rectal cancer  - POC Hemoccult Bld/Stl (3-Cd Home Screen); Future  8. Insomnia  - LORazepam (ATIVAN) 2 MG tablet; TAKE 1/2 TO 1 TABLET BY MOUTH AT BEDTIME.  Dispense: 30 tablet; Refill: 5  9. Depression -screening positive -lexapro taper up to 10 mg daily  10. Need for prophylactic vaccination and inoculation against influenza  - Flu vaccine HIGH DOSE PF (Fluzone High dose)  11. Need for prophylactic vaccination with tetanus-diphtheria (TD)  - DT Vaccine greater than 7yo IM    Discussed med's effects and SE's. Screening labs and tests as requested with regular follow-up as recommended.  HPI  69 y.o. female  presents for a complete physical.  Her blood pressure has been controlled at home, today their BP is BP: 124/82 mmHg.  She does not workout. She denies chest pain, shortness of breath, dizziness.   She is on cholesterol medication and denies myalgias. Her cholesterol is at goal. The cholesterol last visit was:  Lab Results  Component Value Date   CHOL 179 03/21/2014   HDL 50 03/21/2014   LDLCALC 55 03/21/2014   TRIG 368* 03/21/2014   CHOLHDL 3.6 03/21/2014  .  She reports that she hasn't been taking it on a daily basis.  She has been making some dietary changes and would like to come off of it if she can.    She has been working on diet and exercise for prediabetes, she is on bASA, she is not  on ACE/ARB and denies foot ulcerations, hyperglycemia, hypoglycemia , increased appetite, nausea, paresthesia of the feet, polydipsia, polyuria, visual disturbances, vomiting and weight loss. Last A1C in the office was:  Lab Results  Component Value Date   HGBA1C 5.9* 03/21/2014    Patient is on Vitamin D supplement.   Lab Results  Component Value Date   VD25OH 44 03/21/2014     Patient reports that she is seeing Dr. Noel Gerold regularly for her eye exams.  She reports that they are following her closely secondary to a corneal ulcer scar.    Current Medications:  Current Outpatient Prescriptions on File Prior to Visit  Medication Sig Dispense Refill  . acyclovir (ZOVIRAX) 400 MG tablet Take 400 mg by mouth 2 (two) times daily.    Marland Kitchen ALPRAZolam (XANAX) 0.5 MG tablet Take 1 tablet (0.5 mg total) by mouth at bedtime as needed for anxiety. 90 tablet 1  . aspirin 81 MG tablet Take 81 mg by mouth every other day.    . bisoprolol-hydrochlorothiazide (ZIAC) 5-6.25 MG per tablet Take 1 tablet daily for BP 90 tablet 1  . cetirizine (ZYRTEC) 10 MG tablet Take 10 mg by mouth daily.    . Cholecalciferol (VITAMIN D PO) Take 2,000 Units by mouth 3 (three) times daily.    . citalopram (CELEXA) 40 MG tablet Take 1 tablet (40 mg total) by mouth daily. 30 tablet 2  . Coenzyme Q10 (  COQ10 PO) Take 1 capsule by mouth daily.    . Flaxseed, Linseed, (FLAXSEED OIL PO) Take 1 capsule by mouth daily.    . furosemide (LASIX) 40 MG tablet TAKE 1 TABLET BY MOUTH DAILY. 90 tablet 0  . KRILL OIL PO Take 1 capsule by mouth daily.    Marland Kitchen LORazepam (ATIVAN) 2 MG tablet TAKE 1/2 TO 1 TABLET BY MOUTH AT BEDTIME. 30 tablet 5  . Multiple Vitamins-Minerals (MULTIVITAMIN PO) Take by mouth 2 (two) times daily.    Marland Kitchen OVER THE COUNTER MEDICATION Calcium 2 tabs per day    . pravastatin (PRAVACHOL) 40 MG tablet Take 1 tablet (40 mg total) by mouth daily. 90 tablet 1  . prednisoLONE acetate (PRED FORTE) 1 % ophthalmic suspension   1   No  current facility-administered medications on file prior to visit.    Health Maintenance:   Immunization History  Administered Date(s) Administered  . Hepatitis B 03/16/2001, 09/22/2001  . Influenza, High Dose Seasonal PF 11/13/2013  . Pneumococcal Polysaccharide-23 12/25/2010  . Td 06/17/2004  . Zoster 07/08/2010    Tetanus: 01/08/15 Pneumovax:12/25/2010 Flu vaccine: 01/08/15 Zostavax:07/08/10 MGM: scheduled for next week  DEXA:scheduled for next week Colonoscopy:  10/06/13 Last Dental Exam:  She is going twice yearly Last Eye Exam:  Dr. Noel Gerold  Patient Care Team: Lucky Cowboy, MD as PCP - General (Internal Medicine) Vida Rigger, MD as Consulting Physician (Gastroenterology) Elpidio Galea, MD as Consulting Physician (Ophthalmology) Sallye Lat, MD as Consulting Physician (Optometry) Arminda Resides, MD as Consulting Physician (Dermatology)  Allergies:  Allergies  Allergen Reactions  . Ppd [Tuberculin Purified Protein Derivative]     Medical History:  Past Medical History  Diagnosis Date  . Hypertension   . Hyperlipidemia   . Depression   . Allergy   . Anxiety   . Arthritis   . Osteopenia   . Vitamin D deficiency   . Prediabetes   . Acute iritis of left eye     secondary to HSV    Surgical History:  Past Surgical History  Procedure Laterality Date  . Tonsillectomy and adenoidectomy    . Eye surgery Left 11/2012    Cataract- Groat    Family History:  Family History  Problem Relation Age of Onset  . Hyperlipidemia Mother   . Heart disease Mother   . Hypertension Father   . Diabetes Father     Social History:  Social History  Substance Use Topics  . Smoking status: Never Smoker   . Smokeless tobacco: None  . Alcohol Use: None    Review of Systems: Review of Systems  Constitutional: Negative for fever, chills and malaise/fatigue.  HENT: Negative for congestion, ear pain and sore throat.   Eyes: Negative.   Respiratory: Negative for cough,  shortness of breath and wheezing.   Cardiovascular: Negative for chest pain, palpitations and leg swelling.  Gastrointestinal: Negative for heartburn, diarrhea, constipation, blood in stool and melena.  Genitourinary: Negative.   Skin: Negative.   Neurological: Negative for dizziness, sensory change, loss of consciousness and headaches.  Psychiatric/Behavioral: Negative for depression. The patient is not nervous/anxious and does not have insomnia.     Physical Exam: Estimated body mass index is 29.81 kg/(m^2) as calculated from the following:   Height as of this encounter: 5' 3.5" (1.613 m).   Weight as of this encounter: 171 lb (77.565 kg). BP 124/82 mmHg  Pulse 68  Temp(Src) 98.2 F (36.8 C) (Temporal)  Resp 18  Ht 5' 3.5" (1.613  m)  Wt 171 lb (77.565 kg)  BMI 29.81 kg/m2  General Appearance: Well nourished well developed, in no apparent distress.  Eyes: PERRLA, EOMs, conjunctiva no swelling or erythema ENT/Mouth: Ear canals normal without obstruction, swelling, erythema, or discharge.  TMs normal bilaterally with no erythema, bulging, retraction, or loss of landmark.  Oropharynx moist and clear with no exudate, erythema, or swelling.   Neck: Supple, thyroid normal. No bruits.  No cervical adenopathy Respiratory: Respiratory effort normal, Breath sounds clear A&P without wheeze, rhonchi, rales.   Cardio: RRR without murmurs, rubs or gallops. Brisk peripheral pulses without edema.  Chest: symmetric, with normal excursions Breasts: Symmetric, without lumps, nipple discharge, retractions.  Abdomen: Soft, nontender, no guarding, rebound, hernias, masses, or organomegaly.  Lymphatics: Non tender without lymphadenopathy.  Genitourinary:  Musculoskeletal: Full ROM all peripheral extremities,5/5 strength, and normal gait.  Skin: Warm, dry without rashes, lesions, ecchymosis. Neuro: Awake and oriented X 3, Cranial nerves intact, reflexes equal bilaterally. Normal muscle tone, no  cerebellar symptoms. Sensation intact.  Psych:  normal affect, Insight and Judgment appropriate.   EKG: WNL no changes.  Over 40 minutes of exam, counseling, chart review and critical decision making was performed  Toni Amendourtney Forcucci 10:35 AM [facility]

## 2015-01-08 NOTE — Patient Instructions (Signed)
Preventive Care for Adults  A healthy lifestyle and preventive care can promote health and wellness. Preventive health guidelines for women include the following key practices.  A routine yearly physical is a good way to check with your health care provider about your health and preventive screening. It is a chance to share any concerns and updates on your health and to receive a thorough exam.  Visit your dentist for a routine exam and preventive care every 6 months. Brush your teeth twice a day and floss once a day. Good oral hygiene prevents tooth decay and gum disease.  The frequency of eye exams is based on your age, health, family medical history, use of contact lenses, and other factors. Follow your health care provider's recommendations for frequency of eye exams.  Eat a healthy diet. Foods like vegetables, fruits, whole grains, low-fat dairy products, and lean protein foods contain the nutrients you need without too many calories. Decrease your intake of foods high in solid fats, added sugars, and salt. Eat the right amount of calories for you.Get information about a proper diet from your health care provider, if necessary.  Regular physical exercise is one of the most important things you can do for your health. Most adults should get at least 150 minutes of moderate-intensity exercise (any activity that increases your heart rate and causes you to sweat) each week. In addition, most adults need muscle-strengthening exercises on 2 or more days a week.  Maintain a healthy weight. The body mass index (BMI) is a screening tool to identify possible weight problems. It provides an estimate of body fat based on height and weight. Your health care provider can find your BMI and can help you achieve or maintain a healthy weight.For adults 20 years and older:  A BMI below 18.5 is considered underweight.  A BMI of 18.5 to 24.9 is normal.  A BMI of 25 to 29.9 is considered overweight.  A BMI  of 30 and above is considered obese.  Maintain normal blood lipids and cholesterol levels by exercising and minimizing your intake of saturated fat. Eat a balanced diet with plenty of fruit and vegetables. If your lipid or cholesterol levels are high, you are over 50, or you are at high risk for heart disease, you may need your cholesterol levels checked more frequently.Ongoing high lipid and cholesterol levels should be treated with medicines if diet and exercise are not working.  If you smoke, find out from your health care provider how to quit. If you do not use tobacco, do not start.  Lung cancer screening is recommended for adults aged 2-80 years who are at high risk for developing lung cancer because of a history of smoking. A yearly low-dose CT scan of the lungs is recommended for people who have at least a 30-pack-year history of smoking and are a current smoker or have quit within the past 15 years. A pack year of smoking is smoking an average of 1 pack of cigarettes a day for 1 year (for example: 1 pack a day for 30 years or 2 packs a day for 15 years). Yearly screening should continue until the smoker has stopped smoking for at least 15 years. Yearly screening should be stopped for people who develop a health problem that would prevent them from having lung cancer treatment.  Avoid use of street drugs. Do not share needles with anyone. Ask for help if you need support or instructions about stopping the use of drugs.  High  blood pressure causes heart disease and increases the risk of stroke.  Ongoing high blood pressure should be treated with medicines if weight loss and exercise do not work.  If you are 55-79 years old, ask your health care provider if you should take aspirin to prevent strokes.  Diabetes screening involves taking a blood sample to check your fasting blood sugar level. This should be done once every 3 years, after age 45, if you are within normal weight and without risk  factors for diabetes. Testing should be considered at a younger age or be carried out more frequently if you are overweight and have at least 1 risk factor for diabetes.  Breast cancer screening is essential preventive care for women. You should practice "breast self-awareness." This means understanding the normal appearance and feel of your breasts and may include breast self-examination. Any changes detected, no matter how small, should be reported to a health care provider. Women in their 20s and 30s should have a clinical breast exam (CBE) by a health care provider as part of a regular health exam every 1 to 3 years. After age 40, women should have a CBE every year. Starting at age 40, women should consider having a mammogram (breast X-ray test) every year. Women who have a family history of breast cancer should talk to their health care provider about genetic screening. Women at a high risk of breast cancer should talk to their health care providers about having an MRI and a mammogram every year.  Breast cancer gene (BRCA)-related cancer risk assessment is recommended for women who have family members with BRCA-related cancers. BRCA-related cancers include breast, ovarian, tubal, and peritoneal cancers. Having family members with these cancers may be associated with an increased risk for harmful changes (mutations) in the breast cancer genes BRCA1 and BRCA2. Results of the assessment will determine the need for genetic counseling and BRCA1 and BRCA2 testing.  Routine pelvic exams to screen for cancer are no longer recommended for nonpregnant women who are considered low risk for cancer of the pelvic organs (ovaries, uterus, and vagina) and who do not have symptoms. Ask your health care provider if a screening pelvic exam is right for you.  If you have had past treatment for cervical cancer or a condition that could lead to cancer, you need Pap tests and screening for cancer for at least 20 years after  your treatment. If Pap tests have been discontinued, your risk factors (such as having a new sexual partner) need to be reassessed to determine if screening should be resumed. Some women have medical problems that increase the chance of getting cervical cancer. In these cases, your health care provider may recommend more frequent screening and Pap tests.    Colorectal cancer can be detected and often prevented. Most routine colorectal cancer screening begins at the age of 50 years and continues through age 75 years. However, your health care provider may recommend screening at an earlier age if you have risk factors for colon cancer. On a yearly basis, your health care provider may provide home test kits to check for hidden blood in the stool. Use of a small camera at the end of a tube, to directly examine the colon (sigmoidoscopy or colonoscopy), can detect the earliest forms of colorectal cancer. Talk to your health care provider about this at age 50, when routine screening begins. Direct exam of the colon should be repeated every 5-10 years through age 75 years, unless early forms of pre-cancerous   polyps or small growths are found.  Osteoporosis is a disease in which the bones lose minerals and strength with aging. This can result in serious bone fractures or breaks. The risk of osteoporosis can be identified using a bone density scan. Women ages 68 years and over and women at risk for fractures or osteoporosis should discuss screening with their health care providers. Ask your health care provider whether you should take a calcium supplement or vitamin D to reduce the rate of osteoporosis.  Menopause can be associated with physical symptoms and risks. Hormone replacement therapy is available to decrease symptoms and risks. You should talk to your health care provider about whether hormone replacement therapy is right for you.  Use sunscreen. Apply sunscreen liberally and repeatedly throughout the day.  You should seek shade when your shadow is shorter than you. Protect yourself by wearing long sleeves, pants, a wide-brimmed hat, and sunglasses year round, whenever you are outdoors.  Once a month, do a whole body skin exam, using a mirror to look at the skin on your back. Tell your health care provider of new moles, moles that have irregular borders, moles that are larger than a pencil eraser, or moles that have changed in shape or color.  Stay current with required vaccines (immunizations).  Influenza vaccine. All adults should be immunized every year.  Tetanus, diphtheria, and acellular pertussis (Td, Tdap) vaccine. Pregnant women should receive 1 dose of Tdap vaccine during each pregnancy. The dose should be obtained regardless of the length of time since the last dose. Immunization is preferred during the 27th-36th week of gestation. An adult who has not previously received Tdap or who does not know her vaccine status should receive 1 dose of Tdap. This initial dose should be followed by tetanus and diphtheria toxoids (Td) booster doses every 10 years. Adults with an unknown or incomplete history of completing a 3-dose immunization series with Td-containing vaccines should begin or complete a primary immunization series including a Tdap dose. Adults should receive a Td booster every 10 years.    Zoster vaccine. One dose is recommended for adults aged 36 years or older unless certain conditions are present.    Pneumococcal 13-valent conjugate (PCV13) vaccine. When indicated, a person who is uncertain of her immunization history and has no record of immunization should receive the PCV13 vaccine. An adult aged 1 years or older who has certain medical conditions and has not been previously immunized should receive 1 dose of PCV13 vaccine. This PCV13 should be followed with a dose of pneumococcal polysaccharide (PPSV23) vaccine. The PPSV23 vaccine dose should be obtained at least 8 weeks after the  dose of PCV13 vaccine. An adult aged 73 years or older who has certain medical conditions and previously received 1 or more doses of PPSV23 vaccine should receive 1 dose of PCV13. The PCV13 vaccine dose should be obtained 1 or more years after the last PPSV23 vaccine dose.    Pneumococcal polysaccharide (PPSV23) vaccine. When PCV13 is also indicated, PCV13 should be obtained first. All adults aged 47 years and older should be immunized. An adult younger than age 86 years who has certain medical conditions should be immunized. Any person who resides in a nursing home or long-term care facility should be immunized. An adult smoker should be immunized. People with an immunocompromised condition and certain other conditions should receive both PCV13 and PPSV23 vaccines. People with human immunodeficiency virus (HIV) infection should be immunized as soon as possible after diagnosis. Immunization  chemotherapy or radiation therapy should be avoided. Routine use of PPSV23 vaccine is not recommended for American Indians, [place] Natives, or people younger than 65 years unless there are medical conditions that require PPSV23 vaccine. When indicated, people who have unknown immunization and have no record of immunization should receive PPSV23 vaccine. One-time revaccination 5 years after the first dose of PPSV23 is recommended for people aged 19-64 years who have chronic kidney failure, nephrotic syndrome, asplenia, or immunocompromised conditions. People who received 1-2 doses of PPSV23 before age 65 years should receive another dose of PPSV23 vaccine at age 65 years or later if at least 5 years have passed since the previous dose. Doses of PPSV23 are not needed for people immunized with PPSV23 at or after age 65 years.   Preventive Services / Frequency  Ages 65 years and over  Blood pressure check.  Lipid and cholesterol check.  Lung cancer screening. / Every year if you are aged 55-80 years and have a  30-pack-year history of smoking and currently smoke or have quit within the past 15 years. Yearly screening is stopped once you have quit smoking for at least 15 years or develop a health problem that would prevent you from having lung cancer treatment.  Clinical breast exam.** / Every year after age 40 years.  BRCA-related cancer risk assessment.** / For women who have family members with a BRCA-related cancer (breast, ovarian, tubal, or peritoneal cancers).  Mammogram.** / Every year beginning at age 40 years and continuing for as long as you are in good health. Consult with your health care provider.  Pap test.** / Every 3 years starting at age 30 years through age 65 or 70 years with 3 consecutive normal Pap tests. Testing can be stopped between 65 and 70 years with 3 consecutive normal Pap tests and no abnormal Pap or HPV tests in the past 10 years.  Fecal occult blood test (FOBT) of stool. / Every year beginning at age 50 years and continuing until age 75 years. You may not need to do this test if you get a colonoscopy every 10 years.  Flexible sigmoidoscopy or colonoscopy.** / Every 5 years for a flexible sigmoidoscopy or every 10 years for a colonoscopy beginning at age 50 years and continuing until age 75 years.  Hepatitis C blood test.** / For all people born from 1945 through 1965 and any individual with known risks for hepatitis C.  Osteoporosis screening.** / A one-time screening for women ages 65 years and over and women at risk for fractures or osteoporosis.  Skin self-exam. / Monthly.  Influenza vaccine. / Every year.  Tetanus, diphtheria, and acellular pertussis (Tdap/Td) vaccine.** / 1 dose of Td every 10 years.  Zoster vaccine.** / 1 dose for adults aged 60 years or older.  Pneumococcal 13-valent conjugate (PCV13) vaccine.** / Consult your health care provider.  Pneumococcal polysaccharide (PPSV23) vaccine.** / 1 dose for all adults aged 65 years and older. Screening  for abdominal aortic aneurysm (AAA)  by ultrasound is recommended for people who have history of high blood pressure or who are current or former smokers.  

## 2015-01-09 LAB — URINALYSIS, MICROSCOPIC ONLY
BACTERIA UA: NONE SEEN [HPF]
CASTS: NONE SEEN [LPF]
Crystals: NONE SEEN [HPF]
RBC / HPF: NONE SEEN RBC/HPF (ref <=2)
SQUAMOUS EPITHELIAL / LPF: NONE SEEN [HPF] (ref <=5)
Yeast: NONE SEEN [HPF]

## 2015-01-09 LAB — URINALYSIS, ROUTINE W REFLEX MICROSCOPIC
BILIRUBIN URINE: NEGATIVE
GLUCOSE, UA: NEGATIVE
HGB URINE DIPSTICK: NEGATIVE
Ketones, ur: NEGATIVE
Nitrite: NEGATIVE
PH: 6.5 (ref 5.0–8.0)
PROTEIN: NEGATIVE
Specific Gravity, Urine: 1.009 (ref 1.001–1.035)

## 2015-01-09 LAB — MICROALBUMIN / CREATININE URINE RATIO
Creatinine, Urine: 35 mg/dL (ref 20–320)
MICROALB/CREAT RATIO: 11 ug/mg{creat} (ref <30)
Microalb, Ur: 0.4 mg/dL

## 2015-01-09 LAB — HEMOGLOBIN A1C
Hgb A1c MFr Bld: 5.9 % — ABNORMAL HIGH (ref <5.7)
MEAN PLASMA GLUCOSE: 123 mg/dL — AB (ref <117)

## 2015-01-28 ENCOUNTER — Encounter: Payer: Self-pay | Admitting: Physician Assistant

## 2015-01-28 ENCOUNTER — Ambulatory Visit (INDEPENDENT_AMBULATORY_CARE_PROVIDER_SITE_OTHER): Payer: Medicare Other | Admitting: Physician Assistant

## 2015-01-28 VITALS — BP 124/60 | HR 80 | Temp 97.5°F | Resp 14 | Ht 63.5 in | Wt 168.0 lb

## 2015-01-28 DIAGNOSIS — Z79899 Other long term (current) drug therapy: Secondary | ICD-10-CM

## 2015-01-28 DIAGNOSIS — E669 Obesity, unspecified: Secondary | ICD-10-CM

## 2015-01-28 DIAGNOSIS — J0100 Acute maxillary sinusitis, unspecified: Secondary | ICD-10-CM

## 2015-01-28 DIAGNOSIS — N390 Urinary tract infection, site not specified: Secondary | ICD-10-CM | POA: Diagnosis not present

## 2015-01-28 DIAGNOSIS — F32A Depression, unspecified: Secondary | ICD-10-CM | POA: Insufficient documentation

## 2015-01-28 DIAGNOSIS — F329 Major depressive disorder, single episode, unspecified: Secondary | ICD-10-CM | POA: Diagnosis not present

## 2015-01-28 DIAGNOSIS — R6889 Other general symptoms and signs: Secondary | ICD-10-CM

## 2015-01-28 DIAGNOSIS — E559 Vitamin D deficiency, unspecified: Secondary | ICD-10-CM

## 2015-01-28 DIAGNOSIS — Z0000 Encounter for general adult medical examination without abnormal findings: Secondary | ICD-10-CM

## 2015-01-28 DIAGNOSIS — E785 Hyperlipidemia, unspecified: Secondary | ICD-10-CM

## 2015-01-28 DIAGNOSIS — I10 Essential (primary) hypertension: Secondary | ICD-10-CM

## 2015-01-28 DIAGNOSIS — R7303 Prediabetes: Secondary | ICD-10-CM | POA: Diagnosis not present

## 2015-01-28 DIAGNOSIS — Z0001 Encounter for general adult medical examination with abnormal findings: Secondary | ICD-10-CM

## 2015-01-28 MED ORDER — AZITHROMYCIN 250 MG PO TABS: ORAL_TABLET | ORAL | Status: DC

## 2015-01-28 NOTE — Progress Notes (Signed)
Patient ID: Allison Barker, female   DOB: 29-Apr-1945, 69 y.o.   MRN: [id number]   MEDICARE ANNUAL WELLNESS VISIT AND FOLLOW UP  Assessment:   1. Acute maxillary sinusitis, recurrence not specified - azithromycin (ZITHROMAX) 250 MG tablet; Take 2 tablets (500 mg) on  Day 1,  followed by 1 tablet (250 mg) once daily on Days 2 through 5.  Dispense: 6 each; Refill: 1  2. Essential hypertension - continue medications, DASH diet, exercise and monitor at home. Call if greater than 130/80.   3. Hyperlipidemia -continue medications, check lipids, decrease fatty foods, increase activity.   4. Encounter for Medicare annual wellness exam Get MGM and DEXA  5. Obesity Obesity with co morbidities- long discussion about weight loss, diet, and exercise  6. Prediabetes Discussed general issues about diabetes pathophysiology and management., Educational material distributed., Suggested low cholesterol diet., Encouraged aerobic exercise., Discussed foot care., Reminded to get yearly retinal exam.  7. Vitamin D deficiency Continue supplement  8. Recurrent UTI  9. Medication management  10. Depression Improved with lexapro 10  Over 30 minutes of exam, counseling, chart review, and critical decision making was performed  Plan:   During the course of the visit the patient was educated and counseled about appropriate screening and preventive services including:    Pneumococcal vaccine   Influenza vaccine  Td vaccine  Prevnar 13  Screening electrocardiogram  Screening mammography  Bone densitometry screening  Colorectal cancer screening  Diabetes screening  Glaucoma screening  Nutrition counseling   Advanced directives: given info/requested copies  Conditions/risks identified: Diabetes is at goal, ACE/ARB therapy: No, Reason not on Ace Inhibitor/ARB therapy:  preDM Urinary Incontinence is not an issue: discussed non pharmacology and pharmacology options.  Fall risk: low-  discussed PT, home fall assessment, medications.    Subjective:   Allison Barker is a 69 y.o. female who presents for Medicare Annual Wellness Visit and sinus infection.  Date of last medicare wellness visit was 10/2013  Her blood pressure has been controlled at home, today their BP is BP: 124/60 mmHg She does not workout. She denies chest pain, shortness of breath, dizziness.  She is on cholesterol medication and denies myalgias. Her cholesterol is at goal. The cholesterol last visit was:   Lab Results  Component Value Date   CHOL 214* 01/08/2015   HDL 42* 01/08/2015   LDLCALC 97 01/08/2015   TRIG 373* 01/08/2015   CHOLHDL 5.1* 01/08/2015   She has been working on diet and exercise for prediabetes, and denies paresthesia of the feet, polydipsia, polyuria and visual disturbances. Last A1C in the office was:  Lab Results  Component Value Date   HGBA1C 5.9* 01/08/2015  Last GFR Lab Results  Component Value Date   GFRNONAA 61 01/08/2015  Patient is on Vitamin D supplement. Lab Results  Component Value Date   VD25OH 44 03/21/2014  She complains of sinus and cough symptoms since last Sunday. She started out with sore throat that is now resolved, has had chills, sinus pressure congestion, and cough with yellow mucus with fatigue.     Medication Review Current Outpatient Prescriptions on File Prior to Visit  Medication Sig Dispense Refill  . acyclovir (ZOVIRAX) 400 MG tablet Take 400 mg by mouth 2 (two) times daily.    Marland Kitchen ALPRAZolam (XANAX) 0.5 MG tablet Take 1 tablet (0.5 mg total) by mouth at bedtime as needed for anxiety. 90 tablet 1  . aspirin 81 MG tablet Take 81 mg by mouth every  other day.    . bisoprolol-hydrochlorothiazide (ZIAC) 5-6.25 MG per tablet Take 1 tablet daily for BP 90 tablet 1  . cetirizine (ZYRTEC) 10 MG tablet Take 10 mg by mouth daily.    . Cholecalciferol (VITAMIN D PO) Take 2,000 Units by mouth 3 (three) times daily.    . Coenzyme Q10 (COQ10 PO) Take 1  capsule by mouth daily.    Marland Kitchen escitalopram (LEXAPRO) 10 MG tablet Please take a half tablet daily with food for 2 weeks.  If you have no side effects please increase your medication to whole tablet daily 90 tablet 0  . Flaxseed, Linseed, (FLAXSEED OIL PO) Take 1 capsule by mouth daily.    . furosemide (LASIX) 40 MG tablet TAKE 1 TABLET BY MOUTH DAILY. 90 tablet 0  . KRILL OIL PO Take 1 capsule by mouth daily.    Marland Kitchen LORazepam (ATIVAN) 2 MG tablet TAKE 1/2 TO 1 TABLET BY MOUTH AT BEDTIME. 30 tablet 5  . Multiple Vitamins-Minerals (MULTIVITAMIN PO) Take by mouth 2 (two) times daily.    Marland Kitchen OVER THE COUNTER MEDICATION Calcium 2 tabs per day    . pravastatin (PRAVACHOL) 40 MG tablet Take 1 tablet (40 mg total) by mouth daily. 90 tablet 1  . prednisoLONE acetate (PRED FORTE) 1 % ophthalmic suspension   1   No current facility-administered medications on file prior to visit.    Current Problems (verified) Patient Active Problem List   Diagnosis Date Noted  . Medication management 10/03/2014  . Encounter for Medicare annual wellness exam 10/03/2014  . Obesity 03/21/2014  . Recurrent UTI 03/21/2014  . Hypertension   . Hyperlipidemia   . Vitamin D deficiency   . Prediabetes     Screening Tests Immunization History  Administered Date(s) Administered  . DT 01/08/2015  . Hepatitis B 03/16/2001, 09/22/2001  . Influenza, High Dose Seasonal PF 11/13/2013, 01/08/2015  . Pneumococcal Polysaccharide-23 12/25/2010  . Td 06/17/2004  . Zoster 07/08/2010    Preventative care: Tetanus: 01/08/15 Pneumovax:12/25/2010 Prevnar 13: 2014 at [facility] Flu vaccine: 01/08/15 Zostavax:07/08/10  MGM: scheduled for dec 2nd DEXA:scheduled for dec 2nd Colonoscopy: 10/06/13  Names of Other Physician/Practitioners you currently use: 1. [facility]- here for primary care 2. Dr. Noel Gerold eye doctor 3. unknown dentist, last visit q 6 months Patient Care Team: Lucky Cowboy,  MD as PCP - General (Internal Medicine) Vida Rigger, MD as Consulting Physician (Gastroenterology) Elpidio Galea, MD as Consulting Physician (Ophthalmology) Sallye Lat, MD as Consulting Physician (Optometry) Arminda Resides, MD as Consulting Physician (Dermatology)  Past Surgical History  Procedure Laterality Date  . Tonsillectomy and adenoidectomy    . Eye surgery Left 11/2012    Cataract- Groat   Family History  Problem Relation Age of Onset  . Hyperlipidemia Mother   . Heart disease Mother   . Hypertension Father   . Diabetes Father    Social History  Substance Use Topics  . Smoking status: Never Smoker   . Smokeless tobacco: None  . Alcohol Use: None    MEDICARE WELLNESS OBJECTIVES: Tobacco use: She does not smoke.  Patient is not a former smoker. If yes, counseling given Alcohol Current alcohol use: none Osteoporosis: postmenopausal estrogen deficiency and dietary calcium and/or vitamin D deficiency, History of fracture in the past year: no Fall risk: Low Risk Hearing: normal Visual acuity: impaired,  does perform annual eye exam Diet: in general, a "healthy" diet   Physical activity: Current Exercise Habits:: The patient does not  participate in regular exercise at present Cardiac risk factors: Cardiac Risk Factors include: advanced age (>255men, 30>65 women);dyslipidemia;hypertension;obesity (BMI >30kg/m2);sedentary lifestyle Depression/mood screen:   Dep[facility] screen [facility] 01/28/2015  Decreased Interest 1  Down, Depressed, Hopeless 0  PHQ - 2 Score 1  Altered sleeping -  Tired, decreased energy -  Change in appetite -  Feeling bad or failure about yourself  -  Trouble concentrating -  Moving slowly or fidgety/restless -  Suicidal thoughts -  PHQ-9 Score -  Difficult doing work/chores -    ADLs:  In your present state of health, do you have any difficulty performing the following activities: 01/28/2015  Hearing? N  Vision? Y  Difficulty concentrating or  making decisions? N  Walking or climbing stairs? N  Dressing or bathing? N  Doing errands, shopping? N  Preparing Food and eating ? N  Using the Toilet? N  In the past six months, have you accidently leaked urine? N  Do you have problems with loss of bowel control? N  Managing your Medications? N  Managing your Finances? N  Housekeeping or managing your Housekeeping? N     Cognitive Testing  Alert? Yes  Normal Appearance?Yes  Oriented to person? Yes  Place? Yes   Time? Yes  Recall of three objects?  Yes  Can perform simple calculations? Yes  Displays appropriate judgment?Yes  Can read the correct time from a watch face?Yes  EOL planning: Does patient have an advance directive?: Yes Type of Advance Directive: Living will, Healthcare Power of Attorney Does patient want to make changes to advanced directive?: No - Patient declined Copy of advanced directive(s) in chart?: No - copy requested   Objective:   Today's Vitals   01/28/15 1552  BP: 124/60  Pulse: 80  Temp: 97.5 F (36.4 C)  TempSrc: Temporal  Resp: 14  Height: 5' 3.5" (1.613 m)  Weight: 168 lb (76.204 kg)  SpO2: 94%   Body mass index is 29.29 kg/(m^2).  General appearance: alert, no distress, WD/WN,  female HEENT: normocephalic, sclerae anicteric, TMs pearly, nares patent, no discharge or erythema, pharynx normal, + sinus maxillary tenderness Oral cavity: MMM, no lesions Neck: supple, no lymphadenopathy, no thyromegaly, no masses Heart: RRR, normal S1, S2, no murmurs Lungs: CTA bilaterally, no wheezes, rhonchi, or rales Abdomen: +bs, soft, non tender, non distended, no masses, no hepatomegaly, no splenomegaly Musculoskeletal: nontender, no swelling, no obvious deformity Extremities: no edema, no cyanosis, no clubbing Pulses: 2+ symmetric, upper and lower extremities, normal cap refill Neurological: alert, oriented x 3, CN2-12 intact, strength normal upper extremities and lower extremities, sensation normal  throughout, DTRs 2+ throughout, no cerebellar signs, gait normal Psychiatric: normal affect, behavior normal, pleasant  Breast: defer Gyn: defer Rectal: defer   Medicare Attestation I have personally reviewed: The patient's medical and social history Their use of alcohol, tobacco or illicit drugs Their current medications and supplements The patient's functional ability including ADLs,fall risks, home safety risks, cognitive, and hearing and visual impairment Diet and physical activities Evidence for depression or mood disorders  The patient's weight, height, BMI, and visual acuity have been recorded in the chart.  I have made referrals, counseling, and provided education to the patient based on review of the above and I have provided the patient with a written personalized care plan for preventive services.     Quentin Mullingmanda Collier, PA-C   01/28/2015

## 2015-01-28 NOTE — Patient Instructions (Signed)
-Please take Tylenol or Ibuprofen for pain. -Acetaminiphen 325mg  orally every 4-6 hours for pain.  Max: 10 per day -Ibuprofen 200mg  orally every 6-8 hours for pain.  Take with food to avoid ulcers.   Max 10 per day  Please pick one of the over the counter allergy medications below and take it once daily for allergies.  Claritin or loratadine cheapest but likely the weakest  Zyrtec or certizine at night because it can make you sleepy The strongest is allegra or fexafinadine  Cheapest at [employer], [employer], [employer]  -While drinking fluids, pinch and hold nose close and swallow.  This will help open up your eustachian tubes to drain the fluid behind your ear drums. -Try steam showers to open your nasal passages.   Drink lots of water to stay hydrated and to thin mucous.  Flonase/Nasonex is to help the inflammation.  Take 2 sprays in each nostril at bedtime.  Make sure you spray towards the outside of each nostril towards the outer corner of your eye, hold nose close and tilt head back.  This will help the medication get into your sinuses.  If you do not like this medication, then use saline nasal sprays same directions as above for Flonase. Stop the medication right away if you get blurring of your vision or nose bleeds.  Sinusitis Sinusitis is redness, soreness, and inflammation of the paranasal sinuses. Paranasal sinuses are air pockets within the bones of your face (beneath the eyes, the middle of the forehead, or above the eyes). In healthy paranasal sinuses, mucus is able to drain out, and air is able to circulate through them by way of your nose. However, when your paranasal sinuses are inflamed, mucus and air can become trapped. This can allow bacteria and other germs to grow and cause infection. Sinusitis can develop quickly and last only a short time (acute) or continue over a long period (chronic). Sinusitis that lasts for more than 12 weeks is considered chronic.  CAUSES  Causes of  sinusitis include: Allergies. Structural abnormalities, such as displacement of the cartilage that separates your nostrils (deviated septum), which can decrease the air flow through your nose and sinuses and affect sinus drainage. Functional abnormalities, such as when the small hairs (cilia) that line your sinuses and help remove mucus do not work properly or are not present. SIGNS AND SYMPTOMS  Symptoms of acute and chronic sinusitis are the same. The primary symptoms are pain and pressure around the affected sinuses. Other symptoms include: Upper toothache. Earache. Headache. Bad breath. Decreased sense of smell and taste. A cough, which worsens when you are lying flat. Fatigue. Fever. Thick drainage from your nose, which often is green and may contain pus (purulent). Swelling and warmth over the affected sinuses. DIAGNOSIS  Your health care provider will perform a physical exam. During the exam, your health care provider may: Look in your nose for signs of abnormal growths in your nostrils (nasal polyps).  Tap over the affected sinus to check for signs of infection. View the inside of your sinuses (endoscopy) using an imaging device that has a light attached (endoscope). If your health care provider suspects that you have chronic sinusitis, one or more of the following tests may be recommended: Allergy tests. Nasal culture. A sample of mucus is taken from your nose, sent to a lab, and screened for bacteria. Nasal cytology. A sample of mucus is taken from your nose and examined by your health care provider to determine if your sinusitis  is related to an allergy. TREATMENT  Most cases of acute sinusitis are related to a viral infection and will resolve on their own within 10 days. Sometimes medicines are prescribed to help relieve symptoms (pain medicine, decongestants, nasal steroid sprays, or saline sprays).  However, for sinusitis related to a bacterial infection, your health care  provider will prescribe antibiotic medicines. These are medicines that will help kill the bacteria causing the infection.  Rarely, sinusitis is caused by a fungal infection. In theses cases, your health care provider will prescribe antifungal medicine. For some cases of chronic sinusitis, surgery is needed. Generally, these are cases in which sinusitis recurs more than 3 times per year, despite other treatments. HOME CARE INSTRUCTIONS  Drink plenty of water. Water helps thin the mucus so your sinuses can drain more easily. Use a humidifier. Inhale steam 3 to 4 times a day (for example, sit in the bathroom with the shower running). Apply a warm, moist washcloth to your face 3 to 4 times a day, or as directed by your health care provider. Use saline nasal sprays to help moisten and clean your sinuses. Take medicines only as directed by your health care provider. If you were prescribed either an antibiotic or antifungal medicine, finish it all even if you start to feel better. SEEK IMMEDIATE MEDICAL CARE IF: You have increasing pain or severe headaches. You have nausea, vomiting, or drowsiness. You have swelling around your face. You have vision problems. You have a stiff neck. You have difficulty breathing. MAKE SURE YOU:  Understand these instructions. Will watch your condition. Will get help right away if you are not doing well or get worse. Document Released: 03/02/2005 Document Revised: 07/17/2013 Document Reviewed: 03/17/2011 [facility] Patient Information 2015 [place], [place]. This information is not intended to replace advice given to you by your health care provider. Make sure you discuss any questions you have with your health care provider.

## 2015-01-30 ENCOUNTER — Other Ambulatory Visit: Payer: Self-pay | Admitting: Physician Assistant

## 2015-02-01 ENCOUNTER — Other Ambulatory Visit: Payer: Self-pay | Admitting: Internal Medicine

## 2015-02-01 DIAGNOSIS — G4700 Insomnia, unspecified: Secondary | ICD-10-CM

## 2015-02-02 ENCOUNTER — Other Ambulatory Visit: Payer: Self-pay | Admitting: Internal Medicine

## 2015-02-04 NOTE — Telephone Encounter (Signed)
Rx called into [facility].

## 2015-03-29 ENCOUNTER — Other Ambulatory Visit: Payer: Self-pay | Admitting: Internal Medicine

## 2015-04-04 ENCOUNTER — Ambulatory Visit: Payer: Self-pay | Admitting: Internal Medicine

## 2015-04-16 ENCOUNTER — Other Ambulatory Visit: Payer: Self-pay | Admitting: Internal Medicine

## 2015-04-29 DIAGNOSIS — Z961 Presence of intraocular lens: Secondary | ICD-10-CM | POA: Diagnosis not present

## 2015-04-29 DIAGNOSIS — B0052 Herpesviral keratitis: Secondary | ICD-10-CM | POA: Diagnosis not present

## 2015-04-29 DIAGNOSIS — H02834 Dermatochalasis of left upper eyelid: Secondary | ICD-10-CM | POA: Diagnosis not present

## 2015-04-29 DIAGNOSIS — H1712 Central corneal opacity, left eye: Secondary | ICD-10-CM | POA: Diagnosis not present

## 2015-04-29 DIAGNOSIS — H02831 Dermatochalasis of right upper eyelid: Secondary | ICD-10-CM | POA: Diagnosis not present

## 2015-04-29 DIAGNOSIS — H2511 Age-related nuclear cataract, right eye: Secondary | ICD-10-CM | POA: Diagnosis not present

## 2015-05-04 ENCOUNTER — Encounter: Payer: Self-pay | Admitting: *Deleted

## 2015-05-04 ENCOUNTER — Other Ambulatory Visit: Payer: Self-pay | Admitting: Internal Medicine

## 2015-05-20 ENCOUNTER — Other Ambulatory Visit: Payer: Self-pay | Admitting: Internal Medicine

## 2015-05-21 DIAGNOSIS — M2241 Chondromalacia patellae, right knee: Secondary | ICD-10-CM | POA: Diagnosis not present

## 2015-05-21 DIAGNOSIS — M5416 Radiculopathy, lumbar region: Secondary | ICD-10-CM | POA: Diagnosis not present

## 2015-05-21 DIAGNOSIS — M5441 Lumbago with sciatica, right side: Secondary | ICD-10-CM | POA: Diagnosis not present

## 2015-05-21 DIAGNOSIS — S8991XA Unspecified injury of right lower leg, initial encounter: Secondary | ICD-10-CM | POA: Diagnosis not present

## 2015-05-21 DIAGNOSIS — M25561 Pain in right knee: Secondary | ICD-10-CM | POA: Diagnosis not present

## 2015-05-21 DIAGNOSIS — G5791 Unspecified mononeuropathy of right lower limb: Secondary | ICD-10-CM | POA: Diagnosis not present

## 2015-05-27 DIAGNOSIS — M5441 Lumbago with sciatica, right side: Secondary | ICD-10-CM | POA: Diagnosis not present

## 2015-05-27 DIAGNOSIS — G8929 Other chronic pain: Secondary | ICD-10-CM | POA: Diagnosis not present

## 2015-05-29 DIAGNOSIS — G8929 Other chronic pain: Secondary | ICD-10-CM | POA: Diagnosis not present

## 2015-05-29 DIAGNOSIS — M5441 Lumbago with sciatica, right side: Secondary | ICD-10-CM | POA: Diagnosis not present

## 2015-06-03 DIAGNOSIS — G8929 Other chronic pain: Secondary | ICD-10-CM | POA: Diagnosis not present

## 2015-06-03 DIAGNOSIS — M5441 Lumbago with sciatica, right side: Secondary | ICD-10-CM | POA: Diagnosis not present

## 2015-06-05 DIAGNOSIS — M5441 Lumbago with sciatica, right side: Secondary | ICD-10-CM | POA: Diagnosis not present

## 2015-06-05 DIAGNOSIS — G8929 Other chronic pain: Secondary | ICD-10-CM | POA: Diagnosis not present

## 2015-06-10 DIAGNOSIS — G8929 Other chronic pain: Secondary | ICD-10-CM | POA: Diagnosis not present

## 2015-06-10 DIAGNOSIS — M5441 Lumbago with sciatica, right side: Secondary | ICD-10-CM | POA: Diagnosis not present

## 2015-06-15 ENCOUNTER — Other Ambulatory Visit: Payer: Self-pay | Admitting: Internal Medicine

## 2015-06-15 DIAGNOSIS — N390 Urinary tract infection, site not specified: Secondary | ICD-10-CM

## 2015-06-15 MED ORDER — CIPROFLOXACIN HCL 500 MG PO TABS: 500.0000 mg | ORAL_TABLET | Freq: Two times a day (BID) | ORAL | Status: DC

## 2015-06-21 DIAGNOSIS — M5416 Radiculopathy, lumbar region: Secondary | ICD-10-CM | POA: Diagnosis not present

## 2015-06-21 DIAGNOSIS — G5781 Other specified mononeuropathies of right lower limb: Secondary | ICD-10-CM | POA: Diagnosis not present

## 2015-06-21 DIAGNOSIS — M5441 Lumbago with sciatica, right side: Secondary | ICD-10-CM | POA: Diagnosis not present

## 2015-06-24 DIAGNOSIS — G8929 Other chronic pain: Secondary | ICD-10-CM | POA: Diagnosis not present

## 2015-06-24 DIAGNOSIS — M5441 Lumbago with sciatica, right side: Secondary | ICD-10-CM | POA: Diagnosis not present

## 2015-06-26 DIAGNOSIS — M5441 Lumbago with sciatica, right side: Secondary | ICD-10-CM | POA: Diagnosis not present

## 2015-06-26 DIAGNOSIS — G8929 Other chronic pain: Secondary | ICD-10-CM | POA: Diagnosis not present

## 2015-07-01 DIAGNOSIS — G8929 Other chronic pain: Secondary | ICD-10-CM | POA: Diagnosis not present

## 2015-07-01 DIAGNOSIS — M5441 Lumbago with sciatica, right side: Secondary | ICD-10-CM | POA: Diagnosis not present

## 2015-07-03 DIAGNOSIS — M5441 Lumbago with sciatica, right side: Secondary | ICD-10-CM | POA: Diagnosis not present

## 2015-07-03 DIAGNOSIS — G8929 Other chronic pain: Secondary | ICD-10-CM | POA: Diagnosis not present

## 2015-07-09 ENCOUNTER — Ambulatory Visit: Payer: Self-pay | Admitting: Internal Medicine

## 2015-07-25 ENCOUNTER — Other Ambulatory Visit: Payer: Self-pay | Admitting: Internal Medicine

## 2015-07-25 ENCOUNTER — Ambulatory Visit (INDEPENDENT_AMBULATORY_CARE_PROVIDER_SITE_OTHER): Payer: PPO | Admitting: Internal Medicine

## 2015-07-25 ENCOUNTER — Ambulatory Visit (HOSPITAL_COMMUNITY)
Admission: RE | Admit: 2015-07-25 | Discharge: 2015-07-25 | Disposition: A | Discharge status: Home / Self Care | Payer: PPO | Source: Ambulatory Visit | Attending: Internal Medicine | Admitting: Internal Medicine

## 2015-07-25 VITALS — BP 128/82 | HR 90 | Temp 98.0°F | Resp 18 | Ht 63.5 in

## 2015-07-25 DIAGNOSIS — N390 Urinary tract infection, site not specified: Secondary | ICD-10-CM | POA: Diagnosis not present

## 2015-07-25 DIAGNOSIS — M79672 Pain in left foot: Secondary | ICD-10-CM | POA: Diagnosis not present

## 2015-07-25 DIAGNOSIS — F411 Generalized anxiety disorder: Secondary | ICD-10-CM

## 2015-07-25 MED ORDER — FUROSEMIDE 40 MG PO TABS: 40.0000 mg | ORAL_TABLET | Freq: Every day | ORAL | Status: DC

## 2015-07-25 MED ORDER — CIPROFLOXACIN HCL 500 MG PO TABS: 500.0000 mg | ORAL_TABLET | Freq: Two times a day (BID) | ORAL | Status: DC

## 2015-07-25 MED ORDER — ALPRAZOLAM 0.5 MG PO TABS: 0.5000 mg | ORAL_TABLET | Freq: Every evening | ORAL | Status: DC | PRN

## 2015-07-25 MED ORDER — PREDNISONE 20 MG PO TABS: ORAL_TABLET | ORAL | Status: DC

## 2015-07-25 NOTE — Progress Notes (Signed)
   Subjective:    Patient ID: Allison Barker, female    DOB: 12/27/1945, 70 y.o.   MRN: [id number]  HPI  Patient presents to the office for evaluation of low back pain and bladder pressure which has been going on for approximately 1 week.  She reports that she has a concern for bladder infection.  She reports that these are the usual symptoms that she gets with UTI.  She has had one back in January.  She reports that she took a dipstick at home which was positive.  She reports that her left foot has been painful and swelling for the past week. She reports that her left foot is mildly swollen and red.  She reports that it is painful when she walks.  She did soak it in water and she also used some lavender oil on it.  She does remember dropping something on her foot.  She did recently do some walking.    Review of Systems  Constitutional: Negative for fever, chills and fatigue.  Genitourinary: Negative for dysuria, urgency, frequency, hematuria, flank pain, vaginal bleeding, vaginal discharge, difficulty urinating and vaginal pain.  Musculoskeletal: Positive for back pain and arthralgias.  Skin: Negative for rash.       Objective:   Physical Exam  Constitutional: She is oriented to person, place, and time. She appears well-developed and well-nourished. No distress.  HENT:  Head: Normocephalic.  Mouth/Throat: Oropharynx is clear and moist. No oropharyngeal exudate.  Eyes: Conjunctivae are normal. No scleral icterus.  Neck: Normal range of motion. Neck supple. No JVD present. No thyromegaly present.  Cardiovascular: Normal rate, regular rhythm, normal heart sounds and intact distal pulses.  Exam reveals no gallop and no friction rub.   No murmur heard. Pulmonary/Chest: Effort normal and breath sounds normal. No respiratory distress. She has no wheezes. She has no rales. She exhibits no tenderness.  Abdominal: Soft. Normal appearance and bowel sounds are normal. She exhibits no distension and  no mass. There is tenderness in the suprapubic area. There is no rigidity, no rebound, no guarding, no CVA tenderness, no tenderness at McBurney's point and negative Murphy's sign.  Musculoskeletal:       Left ankle: She exhibits decreased range of motion and swelling. She exhibits no ecchymosis, no deformity, no laceration and normal pulse. Tenderness. AITFL tenderness found. No lateral malleolus, no medial malleolus, no CF ligament, no posterior TFL, no head of 5th metatarsal and no proximal fibula tenderness found. Achilles tendon normal.       Feet:  Lymphadenopathy:    She has no cervical adenopathy.  Neurological: She is alert and oriented to person, place, and time.  Skin: Skin is warm and dry. She is not diaphoretic.  Psychiatric: She has a normal mood and affect. Her behavior is normal. Judgment and thought content normal.  Nursing note and vitals reviewed.   Filed Vitals:   07/25/15 1503  BP: 128/82  Pulse: 90  Temp: 98 F (36.7 C)  Resp: 18          Assessment & Plan:    1. Recurrent UTI -Cipro, but hold until culture comes back - Urinalysis, Routine w reflex microscopic ([facility]) - Urine culture  2. Urinary tract infection, site not specified  - ciprofloxacin (CIPRO) 500 MG tablet; Take 1 tablet (500 mg total) by mouth 2 (two) times daily.  Dispense: 20 tablet; Refill: 0  3. Left foot pain -prednisone - [facility] Foot Complete Left; Future

## 2015-07-25 NOTE — Patient Instructions (Signed)
Please take 500 mg of tylenol every 6-8 hours as needed for foot pain.  Please take prednisone until gone.  Please start taking cipro if I let you know that you have a UTI.

## 2015-07-26 LAB — URINALYSIS, ROUTINE W REFLEX MICROSCOPIC
BILIRUBIN URINE: NEGATIVE
GLUCOSE, UA: NEGATIVE
HGB URINE DIPSTICK: NEGATIVE
Ketones, ur: NEGATIVE
Leukocytes, UA: NEGATIVE
Nitrite: NEGATIVE
PH: 6.5 (ref 5.0–8.0)
PROTEIN: NEGATIVE
Specific Gravity, Urine: 1.011 (ref 1.001–1.035)

## 2015-07-26 LAB — URINE CULTURE
Colony Count: NO GROWTH
Organism ID, Bacteria: NO GROWTH

## 2015-08-13 ENCOUNTER — Ambulatory Visit (INDEPENDENT_AMBULATORY_CARE_PROVIDER_SITE_OTHER): Payer: PPO | Admitting: Internal Medicine

## 2015-08-13 ENCOUNTER — Encounter: Payer: Self-pay | Admitting: Internal Medicine

## 2015-08-13 VITALS — BP 118/78 | HR 74 | Temp 98.2°F | Resp 18 | Ht 63.5 in | Wt 174.0 lb

## 2015-08-13 DIAGNOSIS — R7309 Other abnormal glucose: Secondary | ICD-10-CM | POA: Diagnosis not present

## 2015-08-13 DIAGNOSIS — F329 Major depressive disorder, single episode, unspecified: Secondary | ICD-10-CM

## 2015-08-13 DIAGNOSIS — E785 Hyperlipidemia, unspecified: Secondary | ICD-10-CM

## 2015-08-13 DIAGNOSIS — F32A Depression, unspecified: Secondary | ICD-10-CM

## 2015-08-13 DIAGNOSIS — I10 Essential (primary) hypertension: Secondary | ICD-10-CM | POA: Diagnosis not present

## 2015-08-13 DIAGNOSIS — Z79899 Other long term (current) drug therapy: Secondary | ICD-10-CM | POA: Diagnosis not present

## 2015-08-13 DIAGNOSIS — R7303 Prediabetes: Secondary | ICD-10-CM

## 2015-08-13 DIAGNOSIS — E559 Vitamin D deficiency, unspecified: Secondary | ICD-10-CM | POA: Diagnosis not present

## 2015-08-13 LAB — CBC WITH DIFFERENTIAL/PLATELET
BASOS ABS: 0 {cells}/uL (ref 0–200)
Basophils Relative: 0 %
Eosinophils Absolute: 204 cells/uL (ref 15–500)
Eosinophils Relative: 3 %
HEMATOCRIT: 43.2 % (ref 35.0–45.0)
HEMOGLOBIN: 14.3 g/dL (ref 11.7–15.5)
LYMPHS ABS: 1564 {cells}/uL (ref 850–3900)
Lymphocytes Relative: 23 %
MCH: 31.1 pg (ref 27.0–33.0)
MCHC: 33.1 g/dL (ref 32.0–36.0)
MCV: 93.9 fL (ref 80.0–100.0)
MONO ABS: 476 {cells}/uL (ref 200–950)
MPV: 9.3 fL (ref 7.5–12.5)
Monocytes Relative: 7 %
Neutro Abs: 4556 cells/uL (ref 1500–7800)
Neutrophils Relative %: 67 %
Platelets: 332 10*3/uL (ref 140–400)
RBC: 4.6 MIL/uL (ref 3.80–5.10)
RDW: 13.3 % (ref 11.0–15.0)
WBC: 6.8 10*3/uL (ref 3.8–10.8)

## 2015-08-13 LAB — BASIC METABOLIC PANEL WITH GFR
BUN: 11 mg/dL (ref 7–25)
CALCIUM: 8.9 mg/dL (ref 8.6–10.4)
CO2: 27 mmol/L (ref 20–31)
CREATININE: 0.81 mg/dL (ref 0.60–0.93)
Chloride: 105 mmol/L (ref 98–110)
GFR, EST NON AFRICAN AMERICAN: 74 mL/min (ref >=60)
GFR, Est African American: 85 mL/min (ref >=60)
GLUCOSE: 116 mg/dL — AB (ref 65–99)
Potassium: 3.7 mmol/L (ref 3.5–5.3)
Sodium: 142 mmol/L (ref 135–146)

## 2015-08-13 LAB — LIPID PANEL
CHOL/HDL RATIO: 3.1 ratio (ref <=5.0)
Cholesterol: 144 mg/dL (ref 125–200)
HDL: 46 mg/dL (ref >=46)
LDL CALC: 45 mg/dL (ref <130)
TRIGLYCERIDES: 267 mg/dL — AB (ref <150)
VLDL: 53 mg/dL — AB (ref <30)

## 2015-08-13 LAB — HEPATIC FUNCTION PANEL
ALT: 13 U/L (ref 6–29)
AST: 18 U/L (ref 10–35)
Albumin: 3.8 g/dL (ref 3.6–5.1)
Alkaline Phosphatase: 63 U/L (ref 33–130)
BILIRUBIN INDIRECT: 0.5 mg/dL (ref 0.2–1.2)
Bilirubin, Direct: 0.1 mg/dL (ref <=0.2)
TOTAL PROTEIN: 6.7 g/dL (ref 6.1–8.1)
Total Bilirubin: 0.6 mg/dL (ref 0.2–1.2)

## 2015-08-13 LAB — HEMOGLOBIN A1C
HEMOGLOBIN A1C: 5.8 % — AB (ref <5.7)
Mean Plasma Glucose: 120 mg/dL

## 2015-08-13 LAB — TSH: TSH: 2.26 mIU/L

## 2015-08-13 MED ORDER — FUROSEMIDE 40 MG PO TABS: 40.0000 mg | ORAL_TABLET | Freq: Every day | ORAL | Status: DC

## 2015-08-13 MED ORDER — ESCITALOPRAM OXALATE 20 MG PO TABS: ORAL_TABLET | ORAL | Status: DC

## 2015-08-13 MED ORDER — LORAZEPAM 2 MG PO TABS: 1.0000 mg | ORAL_TABLET | Freq: Every day | ORAL | Status: DC

## 2015-08-13 NOTE — Progress Notes (Signed)
Assessment and Plan:  Hypertension:  -Continue medication,  -monitor blood pressure at home.  -Continue DASH diet.   -Reminder to go to the ER if any CP, SOB, nausea, dizziness, severe HA, changes vision/speech, left arm numbness and tingling, and jaw pain.  Cholesterol: -consider adding fenofibrate in -Continue diet and exercise.  -Check cholesterol.   Pre-diabetes: -Continue diet and exercise.  -Check A1C  Vitamin D Def: -continue medications.   Depression -increase lexapro to 20 mg daily -ativan refilled today -paper script given -recommended seeing counselor  Continue diet and meds as discussed. Further disposition pending results of labs.  HPI 70 y.o. female  presents for 3 month follow up with hypertension, hyperlipidemia, prediabetes and vitamin D.   Her blood pressure has been controlled at home, today their BP is BP: 118/78 mmHg.   She does not workout. She denies chest pain, shortness of breath, dizziness.   She is on cholesterol medication and denies myalgias. Her cholesterol is at goal. The cholesterol last visit was:   Lab Results  Component Value Date   CHOL 214* 01/08/2015   HDL 42* 01/08/2015   LDLCALC 97 01/08/2015   TRIG 373* 01/08/2015   CHOLHDL 5.1* 01/08/2015     She has been working on diet and exercise for prediabetes, and denies foot ulcerations, hyperglycemia, hypoglycemia , increased appetite, nausea, paresthesia of the feet, polydipsia, polyuria, visual disturbances, vomiting and weight loss. Last A1C in the office was:  Lab Results  Component Value Date   HGBA1C 5.9* 01/08/2015    Patient is on Vitamin D supplement.  Lab Results  Component Value Date   VD25OH 44 03/21/2014      She notes that she did end up having a toe nail infection on the left side of the toenail and ended up having some purulence.  She notes that she had to take cipro.    She notes that she has been having increased depression.  She notes that she is still  dealing with the loss of her son and her other child also is having marital issues.     Current Medications:  Current Outpatient Prescriptions on File Prior to Visit  Medication Sig Dispense Refill  . acyclovir (ZOVIRAX) 400 MG tablet Take 400 mg by mouth 2 (two) times daily.    Marland Kitchen. ALPRAZolam (XANAX) 0.5 MG tablet Take 1 tablet (0.5 mg total) by mouth at bedtime as needed for anxiety. 90 tablet 1  . aspirin 81 MG tablet Take 81 mg by mouth every other day.    . bisoprolol-hydrochlorothiazide (ZIAC) 5-6.25 MG tablet TAKE 1 TABLET BY MOUTH DAILY FOR BLOOD PRESSURE 90 tablet 1  . cetirizine (ZYRTEC) 10 MG tablet Take 10 mg by mouth daily.    . Cholecalciferol (VITAMIN D PO) Take 2,000 Units by mouth 3 (three) times daily.    . Coenzyme Q10 (COQ10 PO) Take 1 capsule by mouth daily.    Marland Kitchen. escitalopram (LEXAPRO) 10 MG tablet Take 1 tablet daily for Mood 90 tablet 1  . Flaxseed, Linseed, (FLAXSEED OIL PO) Take 1 capsule by mouth daily.    . furosemide (LASIX) 40 MG tablet Take 1 tablet (40 mg total) by mouth daily. 90 tablet 0  . KRILL OIL PO Take 1 capsule by mouth daily.    Marland Kitchen. LORazepam (ATIVAN) 2 MG tablet TAKE 1/2 TO 1 TABLET BY MOUTH AT BEDTIME 30 tablet 0  . Multiple Vitamins-Minerals (MULTIVITAMIN PO) Take by mouth 2 (two) times daily.    Marland Kitchen. OVER  THE COUNTER MEDICATION Calcium 2 tabs per day    . pravastatin (PRAVACHOL) 40 MG tablet TAKE 1 TABLET BY MOUTH DAILY 90 tablet 1  . prednisoLONE acetate (PRED FORTE) 1 % ophthalmic suspension   1   No current facility-administered medications on file prior to visit.    Medical History:  Past Medical History  Diagnosis Date  . Hypertension   . Hyperlipidemia   . Depression   . Allergy   . Anxiety   . Arthritis   . Osteopenia   . Vitamin D deficiency   . Prediabetes   . Acute iritis of left eye     secondary to HSV    Allergies:  Allergies  Allergen Reactions  . Ppd [Tuberculin Purified Protein Derivative]      Review of Systems:   Review of Systems  Constitutional: Negative for fever, chills and malaise/fatigue.  HENT: Negative for congestion, ear pain and sore throat.   Respiratory: Negative for cough, shortness of breath and wheezing.   Cardiovascular: Negative for chest pain, palpitations and leg swelling.  Gastrointestinal: Negative for heartburn, abdominal pain, diarrhea, constipation, blood in stool and melena.  Genitourinary: Negative.   Musculoskeletal: Negative.   Neurological: Negative for dizziness, sensory change, loss of consciousness and headaches.  Psychiatric/Behavioral: Positive for depression. The patient is nervous/anxious. The patient does not have insomnia.     Family history- Review and unchanged  Social history- Review and unchanged  Physical Exam: BP 118/78 mmHg  Pulse 74  Temp(Src) 98.2 F (36.8 C) (Temporal)  Resp 18  Ht 5' 3.5" (1.613 m)  Wt 174 lb (78.926 kg)  BMI 30.34 kg/m2 Wt Readings from Last 3 Encounters:  08/13/15 174 lb (78.926 kg)  01/28/15 168 lb (76.204 kg)  01/08/15 171 lb (77.565 kg)    General Appearance: Well nourished well developed, in no apparent distress. Eyes: PERRLA, EOMs, conjunctiva no swelling or erythema ENT/Mouth: Ear canals normal without obstruction, swelling, erythma, discharge.  TMs normal bilaterally.  Oropharynx moist, clear, without exudate, or postoropharyngeal swelling. Neck: Supple, thyroid normal,no cervical adenopathy  Respiratory: Respiratory effort normal, Breath sounds clear A&P without rhonchi, wheeze, or rale.  No retractions, no accessory usage. Cardio: RRR with no MRGs. Brisk peripheral pulses without edema.  Abdomen: Soft, + BS,  Non tender, no guarding, rebound, hernias, masses. Musculoskeletal: Full ROM, 5/5 strength, Normal gait Skin: Warm, dry without rashes, lesions, ecchymosis.  Neuro: Awake and oriented X 3, Cranial nerves intact. Normal muscle tone, no cerebellar symptoms. Psych: Labile emotions, Normal affect, Insight  and Judgment appropriate.    Terri Piedra, PA-C 9:52 AM [facility]

## 2015-09-24 ENCOUNTER — Other Ambulatory Visit: Payer: Self-pay | Admitting: Internal Medicine

## 2015-10-07 DIAGNOSIS — H04123 Dry eye syndrome of bilateral lacrimal glands: Secondary | ICD-10-CM | POA: Diagnosis not present

## 2015-10-07 DIAGNOSIS — H1712 Central corneal opacity, left eye: Secondary | ICD-10-CM | POA: Diagnosis not present

## 2015-10-07 DIAGNOSIS — H2511 Age-related nuclear cataract, right eye: Secondary | ICD-10-CM | POA: Diagnosis not present

## 2015-10-07 DIAGNOSIS — B0052 Herpesviral keratitis: Secondary | ICD-10-CM | POA: Diagnosis not present

## 2015-10-14 DIAGNOSIS — H1712 Central corneal opacity, left eye: Secondary | ICD-10-CM | POA: Diagnosis not present

## 2015-10-14 DIAGNOSIS — H04123 Dry eye syndrome of bilateral lacrimal glands: Secondary | ICD-10-CM | POA: Diagnosis not present

## 2015-10-14 DIAGNOSIS — B0052 Herpesviral keratitis: Secondary | ICD-10-CM | POA: Diagnosis not present

## 2015-10-14 DIAGNOSIS — H2511 Age-related nuclear cataract, right eye: Secondary | ICD-10-CM | POA: Diagnosis not present

## 2015-10-29 ENCOUNTER — Other Ambulatory Visit: Payer: Self-pay | Admitting: *Deleted

## 2015-10-29 MED ORDER — LORAZEPAM 2 MG PO TABS: 1.0000 mg | ORAL_TABLET | Freq: Every day | ORAL | 0 refills | Status: DC

## 2015-10-30 DIAGNOSIS — H16302 Unspecified interstitial keratitis, left eye: Secondary | ICD-10-CM | POA: Diagnosis not present

## 2015-10-30 DIAGNOSIS — H17822 Peripheral opacity of cornea, left eye: Secondary | ICD-10-CM | POA: Diagnosis not present

## 2015-10-30 DIAGNOSIS — H1712 Central corneal opacity, left eye: Secondary | ICD-10-CM | POA: Diagnosis not present

## 2015-10-30 DIAGNOSIS — H04123 Dry eye syndrome of bilateral lacrimal glands: Secondary | ICD-10-CM | POA: Diagnosis not present

## 2015-10-30 DIAGNOSIS — H2511 Age-related nuclear cataract, right eye: Secondary | ICD-10-CM | POA: Diagnosis not present

## 2015-11-11 ENCOUNTER — Other Ambulatory Visit: Payer: Self-pay | Admitting: Internal Medicine

## 2015-12-25 ENCOUNTER — Ambulatory Visit (INDEPENDENT_AMBULATORY_CARE_PROVIDER_SITE_OTHER): Payer: PPO | Admitting: Internal Medicine

## 2015-12-25 ENCOUNTER — Encounter: Payer: Self-pay | Admitting: Internal Medicine

## 2015-12-25 VITALS — BP 110/66 | HR 76 | Temp 98.4°F | Resp 18 | Ht 63.5 in | Wt 183.0 lb

## 2015-12-25 DIAGNOSIS — R300 Dysuria: Secondary | ICD-10-CM

## 2015-12-25 DIAGNOSIS — Z23 Encounter for immunization: Secondary | ICD-10-CM | POA: Diagnosis not present

## 2015-12-25 DIAGNOSIS — B351 Tinea unguium: Secondary | ICD-10-CM

## 2015-12-25 MED ORDER — TERBINAFINE HCL 250 MG PO TABS: 250.0000 mg | ORAL_TABLET | Freq: Every day | ORAL | 0 refills | Status: DC

## 2015-12-25 NOTE — Progress Notes (Signed)
   Subjective:    Patient ID: Allison Barker, female    DOB: 02/26/1946, 70 y.o.   MRN: [id number]     Patient presents to the office for evaluation of dysuria.  Patient reports that she had some burning this morning and last night.  She reports that she also was having some suprapubic pressure as well.    She reports that she is also having some toenail thickening.  She reports that she noticed it back in May.  She soaked it in epsom salt and has also noted that she has used a topical.  She reports that her whole family is having issues with it.  She reports that she is having some cracking.    Review of Systems  Constitutional: Negative for chills, fatigue and fever.  Respiratory: Negative for chest tightness, shortness of breath and wheezing.   Cardiovascular: Negative for chest pain and palpitations.  Gastrointestinal: Negative for abdominal pain, anal bleeding, blood in stool, constipation, diarrhea, nausea and vomiting.  Genitourinary: Positive for decreased urine volume, dysuria, frequency and urgency. Negative for hematuria and pelvic pain.  Musculoskeletal: Negative.   Skin: Negative.   Neurological: Negative for dizziness, weakness and numbness.       Objective:   Physical Exam  Constitutional: She is oriented to person, place, and time. She appears well-developed and well-nourished. No distress.  HENT:  Head: Normocephalic.  Mouth/Throat: Oropharynx is clear and moist. No oropharyngeal exudate.  Eyes: Conjunctivae are normal. No scleral icterus.  Neck: Normal range of motion. Neck supple. No JVD present. No thyromegaly present.  Cardiovascular: Normal rate, regular rhythm, normal heart sounds and intact distal pulses.  Exam reveals no gallop and no friction rub.   No murmur heard. Pulmonary/Chest: Effort normal and breath sounds normal. No respiratory distress. She has no wheezes. She has no rales. She exhibits no tenderness.  Abdominal: Soft. Normal appearance and bowel  sounds are normal. She exhibits no distension and no mass. There is tenderness in the suprapubic area. There is no rebound, no guarding and no CVA tenderness.  Musculoskeletal: Normal range of motion.  Lymphadenopathy:    She has no cervical adenopathy.  Neurological: She is alert and oriented to person, place, and time.  Skin: Skin is warm and dry. She is not diaphoretic.  Right great toenail with thickening.  No ingrown toenail.  No redness.    Psychiatric: She has a normal mood and affect. Her behavior is normal. Judgment and thought content normal.  Nursing note and vitals reviewed.   Vitals:   12/25/15 1448  BP: 110/66  Pulse: 76  Resp: 18  Temp: 98.4 F (36.9 C)          Assessment & Plan:    1. Dysuria -wait until UA comes back to determine whether abx needed - Urinalysis, Routine w reflex microscopic ([facility]) - Urine culture  2.  Onychomycosis -lamasil -recheck LFTs in 6 weeks

## 2015-12-26 ENCOUNTER — Other Ambulatory Visit: Payer: Self-pay | Admitting: Internal Medicine

## 2015-12-26 ENCOUNTER — Other Ambulatory Visit: Payer: Self-pay | Admitting: *Deleted

## 2015-12-26 LAB — URINALYSIS, ROUTINE W REFLEX MICROSCOPIC
Bilirubin Urine: NEGATIVE
GLUCOSE, UA: NEGATIVE
HGB URINE DIPSTICK: NEGATIVE
KETONES UR: NEGATIVE
NITRITE: POSITIVE — AB
PH: 5 (ref 5.0–8.0)
PROTEIN: NEGATIVE
Specific Gravity, Urine: 1.009 (ref 1.001–1.035)

## 2015-12-26 LAB — URINALYSIS, MICROSCOPIC ONLY
Crystals: NONE SEEN [HPF]
YEAST: NONE SEEN [HPF]

## 2015-12-26 LAB — URINE CULTURE

## 2015-12-26 MED ORDER — CIPROFLOXACIN HCL 500 MG PO TABS: 500.0000 mg | ORAL_TABLET | Freq: Two times a day (BID) | ORAL | 0 refills | Status: AC

## 2015-12-26 MED ORDER — FLUCONAZOLE 150 MG PO TABS: 150.0000 mg | ORAL_TABLET | Freq: Every day | ORAL | 0 refills | Status: DC

## 2016-01-01 ENCOUNTER — Other Ambulatory Visit: Payer: Self-pay | Admitting: Internal Medicine

## 2016-01-08 ENCOUNTER — Encounter: Payer: Self-pay | Admitting: Internal Medicine

## 2016-02-05 ENCOUNTER — Ambulatory Visit (INDEPENDENT_AMBULATORY_CARE_PROVIDER_SITE_OTHER): Payer: PPO | Admitting: Internal Medicine

## 2016-02-05 ENCOUNTER — Encounter: Payer: Self-pay | Admitting: Internal Medicine

## 2016-02-05 VITALS — BP 126/80 | HR 72 | Temp 97.8°F | Resp 18 | Ht 63.5 in | Wt 180.0 lb

## 2016-02-05 DIAGNOSIS — I10 Essential (primary) hypertension: Secondary | ICD-10-CM

## 2016-02-05 DIAGNOSIS — Z0001 Encounter for general adult medical examination with abnormal findings: Secondary | ICD-10-CM

## 2016-02-05 DIAGNOSIS — E559 Vitamin D deficiency, unspecified: Secondary | ICD-10-CM

## 2016-02-05 DIAGNOSIS — Z6831 Body mass index (BMI) 31.0-31.9, adult: Secondary | ICD-10-CM

## 2016-02-05 DIAGNOSIS — R7303 Prediabetes: Secondary | ICD-10-CM | POA: Diagnosis not present

## 2016-02-05 DIAGNOSIS — Z79899 Other long term (current) drug therapy: Secondary | ICD-10-CM

## 2016-02-05 DIAGNOSIS — E782 Mixed hyperlipidemia: Secondary | ICD-10-CM

## 2016-02-05 DIAGNOSIS — E6609 Other obesity due to excess calories: Secondary | ICD-10-CM

## 2016-02-05 DIAGNOSIS — Z136 Encounter for screening for cardiovascular disorders: Secondary | ICD-10-CM

## 2016-02-05 DIAGNOSIS — Z0000 Encounter for general adult medical examination without abnormal findings: Secondary | ICD-10-CM

## 2016-02-05 LAB — CBC WITH DIFFERENTIAL/PLATELET
BASOS ABS: 66 {cells}/uL (ref 0–200)
Basophils Relative: 1 %
EOS PCT: 2 %
Eosinophils Absolute: 132 cells/uL (ref 15–500)
HCT: 46 % — ABNORMAL HIGH (ref 35.0–45.0)
Hemoglobin: 15.4 g/dL (ref 11.7–15.5)
Lymphocytes Relative: 32 %
Lymphs Abs: 2112 cells/uL (ref 850–3900)
MCH: 33.6 pg — AB (ref 27.0–33.0)
MCHC: 33.5 g/dL (ref 32.0–36.0)
MCV: 100.2 fL — ABNORMAL HIGH (ref 80.0–100.0)
MONOS PCT: 11 %
MPV: 9.2 fL (ref 7.5–12.5)
Monocytes Absolute: 726 cells/uL (ref 200–950)
NEUTROS PCT: 54 %
Neutro Abs: 3564 cells/uL (ref 1500–7800)
PLATELETS: 296 10*3/uL (ref 140–400)
RBC: 4.59 MIL/uL (ref 3.80–5.10)
RDW: 13.2 % (ref 11.0–15.0)
WBC: 6.6 10*3/uL (ref 3.8–10.8)

## 2016-02-05 LAB — HEMOGLOBIN A1C
Hgb A1c MFr Bld: 5.5 % (ref <5.7)
MEAN PLASMA GLUCOSE: 111 mg/dL

## 2016-02-05 LAB — TSH: TSH: 2.08 mIU/L

## 2016-02-05 NOTE — Progress Notes (Signed)
Complete Physical  Assessment and Plan:   1. Encounter for general adult medical examination with abnormal findings  - CBC with Differential/Platelet - BASIC METABOLIC PANEL WITH GFR - Hepatic function panel - Magnesium  2. Essential hypertension  - Urinalysis, Routine w reflex microscopic (not at [facility]) - Microalbumin / creatinine urine ratio - EKG 12-Lead - TSH  3. Mixed hyperlipidemia  - Lipid panel  4. Prediabetes  - Insulin, random - Hemoglobin A1c  5. Vitamin D deficiency  - VITAMIN D 25 Hydroxy (Vit-D Deficiency, Fractures)  6. Medication management   7. Class 1 obesity due to excess calories with serious comorbidity and body mass index (BMI) of 31.0 to 31.9 in adult -cont diet and exercise  8.  Onychomycosis -cont lamasil -will likely lose great toenail  Discussed med's effects and SE's. Screening labs and tests as requested with regular follow-up as recommended.  HPI  70 y.o. female  presents for a complete physical.  Her blood pressure has been controlled at home, today their BP is BP: 126/80.  She does not workout. She denies chest pain, shortness of breath, dizziness. She does work out in her yard and also does house work.    She is on cholesterol medication and denies myalgias. Her cholesterol is at goal. The cholesterol last visit was:  Lab Results  Component Value Date   CHOL 144 08/13/2015   HDL 46 08/13/2015   LDLCALC 45 08/13/2015   TRIG 267 (H) 08/13/2015   CHOLHDL 3.1 08/13/2015  .  She has been working on diet and exercise for prediabetes, she is on bASA, she is not on ACE/ARB and denies foot ulcerations, hyperglycemia, hypoglycemia , increased appetite, nausea, paresthesia of the feet, polydipsia, polyuria, visual disturbances, vomiting and weight loss. Last A1C in the office was:  Lab Results  Component Value Date   HGBA1C 5.8 (H) 08/13/2015    Patient is on Vitamin D supplement.   Lab Results  Component Value Date   VD25OH 44  03/21/2014     Patient reports that she has been taking her lamasil.  She reports that she is afraid that the toe nail is going to fall off.  She reports that she is not sure why it is going to fall off.    Current Medications:  Current Outpatient Prescriptions on File Prior to Visit  Medication Sig Dispense Refill  . acyclovir (ZOVIRAX) 400 MG tablet Take 400 mg by mouth 2 (two) times daily.    Marland Kitchen. ALPRAZolam (XANAX) 0.5 MG tablet Take 1 tablet (0.5 mg total) by mouth at bedtime as needed for anxiety. 90 tablet 1  . aspirin 81 MG tablet Take 81 mg by mouth every other day.    . bisoprolol-hydrochlorothiazide (ZIAC) 5-6.25 MG tablet TAKE 1 TABLET BY MOUTH ONCE DAILY FOR BLOOD PRESSURE. 90 tablet 0  . cetirizine (ZYRTEC) 10 MG tablet Take 10 mg by mouth daily.    . Cholecalciferol (VITAMIN D PO) Take 2,000 Units by mouth 3 (three) times daily.    . Coenzyme Q10 (COQ10 PO) Take 1 capsule by mouth daily.    Marland Kitchen. escitalopram (LEXAPRO) 20 MG tablet TAKE 1 TABLET BY MOUTH DAILY FOR MOOD 90 tablet 1  . Flaxseed, Linseed, (FLAXSEED OIL PO) Take 1 capsule by mouth daily.    . fluconazole (DIFLUCAN) 150 MG tablet Take 1 tablet (150 mg total) by mouth daily. Take 1 pill by mouth once per week 2 tablet 0  . furosemide (LASIX) 40 MG tablet TAKE 1  TABLET BY MOUTH DAILY 90 tablet 1  . KRILL OIL PO Take 1 capsule by mouth daily.    Marland Kitchen LORazepam (ATIVAN) 2 MG tablet Take 0.5-1 tablets (1-2 mg total) by mouth at bedtime. 90 tablet 0  . Multiple Vitamins-Minerals (MULTIVITAMIN PO) Take by mouth 2 (two) times daily.    Marland Kitchen OVER THE COUNTER MEDICATION Calcium 2 tabs per day    . OVER THE COUNTER MEDICATION 1 capsule daily. Tumeric    . pravastatin (PRAVACHOL) 40 MG tablet TAKE 1 TABLET BY MOUTH DAILY 90 tablet 1  . prednisoLONE acetate (PRED FORTE) 1 % ophthalmic suspension   1  . terbinafine (LAMISIL) 250 MG tablet Take 1 tablet (250 mg total) by mouth daily. 90 tablet 0   No current facility-administered  medications on file prior to visit.     Health Maintenance:   Immunization History  Administered Date(s) Administered  . DT 01/08/2015  . Hepatitis B 03/16/2001, 09/22/2001  . Influenza, High Dose Seasonal PF 11/13/2013, 01/08/2015, 12/25/2015  . Pneumococcal Polysaccharide-23 12/25/2010  . Td 06/17/2004  . Zoster 07/08/2010   [id number] MGM: 2015 Colonoscopy: 2015 Last Eye Exam:Dr. Dione Booze  Patient Care Team: Lucky Cowboy, MD as PCP - General (Internal Medicine) Vida Rigger, MD as Consulting Physician (Gastroenterology) Elpidio Galea, MD as Consulting Physician (Ophthalmology) Sallye Lat, MD as Consulting Physician (Optometry) Arminda Resides, MD as Consulting Physician (Dermatology)  Allergies:  Allergies  Allergen Reactions  . Ppd [Tuberculin Purified Protein Derivative]     Medical History:  Past Medical History:  Diagnosis Date  . 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 Allergy   . Anxiety   . Arthritis   . Depression   . Hyperlipidemia   . Hypertension   . Osteopenia   . Prediabetes   . Vitamin D deficiency     Surgical History:  Past Surgical History:  Procedure Laterality Date  . EYE SURGERY Left 11/2012   Cataract- Groat  . TONSILLECTOMY AND ADENOIDECTOMY      Family History:  Family History  Problem Relation Age of Onset  . Hyperlipidemia Mother   . Heart disease Mother   . Hypertension Father   . Diabetes Father     Social History:  Social History  Substance Use Topics  . Smoking status: Never Smoker  . Smokeless tobacco: Not on file  . Alcohol use Not on file    Review of Systems: Review of Systems  Constitutional: Negative for chills, fever and malaise/fatigue.  HENT: Negative for congestion, ear pain and sore throat.   Eyes: Negative.   Respiratory: Negative for cough, shortness of breath and wheezing.   Cardiovascular: Negative for chest pain, palpitations and leg swelling.  Gastrointestinal: Negative for  abdominal pain, blood in stool, constipation, diarrhea, heartburn and melena.  Genitourinary: Negative.   Skin: Negative.   Neurological: Negative for dizziness, sensory change, loss of consciousness and headaches.  Psychiatric/Behavioral: Negative for depression. The patient is not nervous/anxious and does not have insomnia.     Physical Exam: Estimated body mass index is 31.39 kg/m as calculated from the following:   Height as of this encounter: 5' 3.5" (1.613 m).   Weight as of this encounter: 180 lb (81.6 kg). BP 126/80   Temp 97.8 F (36.6 C) (Temporal)   Resp 18   Ht 5' 3.5" (1.613 m)   Wt 180 lb (81.6 kg)   BMI 31.39 kg/m   General Appearance: Well nourished well developed, in no  apparent distress.  Eyes: PERRLA, EOMs, conjunctiva no swelling or erythema ENT/Mouth: Ear canals normal without obstruction, swelling, erythema, or discharge.  TMs normal bilaterally with no erythema, bulging, retraction, or loss of landmark.  Oropharynx moist and clear with no exudate, erythema, or swelling.   Neck: Supple, thyroid normal. No bruits.  No cervical adenopathy Respiratory: Respiratory effort normal, Breath sounds clear A&P without wheeze, rhonchi, rales.   Cardio: RRR without murmurs, rubs or gallops. Brisk peripheral pulses without edema.  Chest: symmetric, with normal excursions Breasts: Symmetric, without lumps, nipple discharge, retractions.  Abdomen: Soft, nontender, no guarding, rebound, hernias, masses, or organomegaly.  Lymphatics: Non tender without lymphadenopathy.  Musculoskeletal: Full ROM all peripheral extremities,5/5 strength, and normal gait.  Skin: Warm, dry without rashes, lesions, ecchymosis. Neuro: Awake and oriented X 3, Cranial nerves intact, reflexes equal bilaterally. Normal muscle tone, no cerebellar symptoms. Sensation intact.  Psych:  normal affect, Insight and Judgment appropriate.   EKG: WNL no changes.  Over 40 minutes of exam, counseling, chart  review and critical decision making was performed  Allison Barker 9:10 AM [facility]

## 2016-02-06 LAB — LIPID PANEL
CHOL/HDL RATIO: 3.6 ratio (ref <5.0)
CHOLESTEROL: 167 mg/dL (ref <200)
HDL: 47 mg/dL — AB (ref >50)
LDL Cholesterol: 64 mg/dL (ref <100)
TRIGLYCERIDES: 281 mg/dL — AB (ref <150)
VLDL: 56 mg/dL — ABNORMAL HIGH (ref <30)

## 2016-02-06 LAB — URINALYSIS, MICROSCOPIC ONLY
Bacteria, UA: NONE SEEN [HPF]
Casts: NONE SEEN [LPF]
Yeast: NONE SEEN [HPF]

## 2016-02-06 LAB — BASIC METABOLIC PANEL WITH GFR
BUN: 16 mg/dL (ref 7–25)
CALCIUM: 9.2 mg/dL (ref 8.6–10.4)
CO2: 23 mmol/L (ref 20–31)
CREATININE: 0.93 mg/dL (ref 0.60–0.93)
Chloride: 103 mmol/L (ref 98–110)
GFR, EST AFRICAN AMERICAN: 72 mL/min (ref >=60)
GFR, Est Non African American: 62 mL/min (ref >=60)
Glucose, Bld: 104 mg/dL — ABNORMAL HIGH (ref 65–99)
Potassium: 4 mmol/L (ref 3.5–5.3)
SODIUM: 138 mmol/L (ref 135–146)

## 2016-02-06 LAB — HEPATIC FUNCTION PANEL
ALT: 23 U/L (ref 6–29)
AST: 32 U/L (ref 10–35)
Albumin: 4.1 g/dL (ref 3.6–5.1)
Alkaline Phosphatase: 64 U/L (ref 33–130)
BILIRUBIN DIRECT: 0.1 mg/dL (ref <=0.2)
BILIRUBIN TOTAL: 0.5 mg/dL (ref 0.2–1.2)
Indirect Bilirubin: 0.4 mg/dL (ref 0.2–1.2)
Total Protein: 7.2 g/dL (ref 6.1–8.1)

## 2016-02-06 LAB — URINALYSIS, ROUTINE W REFLEX MICROSCOPIC
Bilirubin Urine: NEGATIVE
GLUCOSE, UA: NEGATIVE
Hgb urine dipstick: NEGATIVE
Ketones, ur: NEGATIVE
Nitrite: NEGATIVE
PROTEIN: NEGATIVE
Specific Gravity, Urine: 1.02 (ref 1.001–1.035)
pH: 5.5 (ref 5.0–8.0)

## 2016-02-06 LAB — MICROALBUMIN / CREATININE URINE RATIO
Creatinine, Urine: 173 mg/dL (ref 20–320)
MICROALB/CREAT RATIO: 29 ug/mg{creat} (ref <30)
Microalb, Ur: 5 mg/dL

## 2016-02-06 LAB — MAGNESIUM: MAGNESIUM: 2.1 mg/dL (ref 1.5–2.5)

## 2016-02-06 LAB — INSULIN, RANDOM: Insulin: 15 u[IU]/mL (ref 2.0–19.6)

## 2016-02-06 LAB — VITAMIN D 25 HYDROXY (VIT D DEFICIENCY, FRACTURES): VIT D 25 HYDROXY: 48 ng/mL (ref 30–100)

## 2016-02-12 DIAGNOSIS — H16302 Unspecified interstitial keratitis, left eye: Secondary | ICD-10-CM | POA: Diagnosis not present

## 2016-02-12 DIAGNOSIS — H17822 Peripheral opacity of cornea, left eye: Secondary | ICD-10-CM | POA: Diagnosis not present

## 2016-02-12 DIAGNOSIS — H04123 Dry eye syndrome of bilateral lacrimal glands: Secondary | ICD-10-CM | POA: Diagnosis not present

## 2016-02-12 DIAGNOSIS — H2511 Age-related nuclear cataract, right eye: Secondary | ICD-10-CM | POA: Diagnosis not present

## 2016-02-12 DIAGNOSIS — H1712 Central corneal opacity, left eye: Secondary | ICD-10-CM | POA: Diagnosis not present

## 2016-02-13 ENCOUNTER — Encounter: Payer: Self-pay | Admitting: Internal Medicine

## 2016-03-23 ENCOUNTER — Other Ambulatory Visit: Payer: Self-pay | Admitting: Internal Medicine

## 2016-04-01 ENCOUNTER — Other Ambulatory Visit: Payer: Self-pay | Admitting: Internal Medicine

## 2016-05-19 ENCOUNTER — Other Ambulatory Visit: Payer: Self-pay | Admitting: Physician Assistant

## 2016-06-25 ENCOUNTER — Other Ambulatory Visit: Payer: Self-pay | Admitting: Internal Medicine

## 2016-06-26 DIAGNOSIS — M25572 Pain in left ankle and joints of left foot: Secondary | ICD-10-CM | POA: Diagnosis not present

## 2016-07-15 ENCOUNTER — Ambulatory Visit: Payer: Self-pay | Admitting: Physician Assistant

## 2016-07-15 ENCOUNTER — Ambulatory Visit: Payer: Self-pay | Admitting: Internal Medicine

## 2016-07-22 NOTE — Progress Notes (Signed)
MEDICARE ANNUAL WELLNESS VISIT AND 3 month follow up  Assessment:   Essential hypertension - continue medications, DASH diet, exercise and monitor at home. Call if greater than 130/80.  -     CBC with Differential/Platelet -     BASIC METABOLIC PANEL WITH GFR -     Hepatic function panel -     TSH  Mixed hyperlipidemia -continue medications, check lipids, decrease fatty foods, increase activity.  -     Lipid panel  Prediabetes Discussed general issues about diabetes pathophysiology and management., Educational material distributed., Suggested low cholesterol diet., Encouraged aerobic exercise., Discussed foot care., Reminded to get yearly retinal exam. -     Hemoglobin A1c  Vitamin D deficiency Continue supplement  Medication management -     Magnesium  Recurrent major depressive disorder, in partial remission (HCC) - continue medications, stress management techniques discussed, increase water, good sleep hygiene discussed, increase exercise, and increase veggies.  -     LORazepam (ATIVAN) 2 MG tablet; TAKE 1/2 TO 1 TABLET BY MOUTH NIGHTLY ATBEDTIME.  Encounter for Medicare annual wellness exam  Recurrent UTI monitor  Class 1 obesity due to excess calories with serious comorbidity and body mass index (BMI) of 31.0 to 31.9 in adult - long discussion about weight loss, diet, and exercise  Onychomycosis Will remove toenail and continue lamisil  Ocular herpes zoster Continue follow up eye doctor  Other orders -     Pneumococcal conjugate vaccine 13-valent IM  Over 40 minutes of exam, counseling, chart review and critical decision making was performed Future Appointments Date Time Provider Department Center  09/18/2016 11:15 AM Quentin Mulling, PA-C [facility] None  02/08/2017 9:00 AM Forcucci, Toni Amend, PA-C [facility] None     Plan:   During the course of the visit the patient was educated and counseled about appropriate screening and preventive services  including:    Pneumococcal vaccine   Prevnar 13  Influenza vaccine  Td vaccine  Screening electrocardiogram  Bone densitometry screening  Colorectal cancer screening  Diabetes screening  Glaucoma screening  Nutrition counseling   Advanced directives: requested   Subjective:  Allison Barker is a 71 y.o. female who presents for Medicare Annual Wellness Visit and follow up.    Her blood pressure has been controlled at home, today their BP is BP: 128/82 She does not workout. She denies chest pain, shortness of breath, dizziness.  She is on cholesterol medication and denies myalgias. Her cholesterol is at goal. The cholesterol last visit was:   Lab Results  Component Value Date   CHOL 167 02/05/2016   HDL 47 (L) 02/05/2016   LDLCALC 64 02/05/2016   TRIG 281 (H) 02/05/2016   CHOLHDL 3.6 02/05/2016   Last Z6X normal range, but has been in the preDM range in the past.  Lab Results  Component Value Date   HGBA1C 5.5 02/05/2016   Last GFR: Lab Results  Component Value Date   GFRNONAA 62 02/05/2016   Patient is on Vitamin D supplement.   Lab Results  Component Value Date   VD25OH 48 02/05/2016     BMI is Body mass index is 31.04 kg/m., she is working on diet and exercise. Wt Readings from Last 3 Encounters:  07/23/16 178 lb (80.7 kg)  02/05/16 180 lb (81.6 kg)  12/25/15 183 lb (83 kg)   She has ocular herpes, on acyclovir for this and follows Dr. Noel Gerold every 6 months.    Medication Review: Current Outpatient Prescriptions on File Prior  to Visit  Medication Sig Dispense Refill  . acyclovir (ZOVIRAX) 400 MG tablet Take 400 mg by mouth 2 (two) times daily.    Marland Kitchen. ALPRAZolam (XANAX) 0.5 MG tablet Take 1 tablet (0.5 mg total) by mouth at bedtime as needed for anxiety. 90 tablet 1  . aspirin 81 MG tablet Take 81 mg by mouth every other day.    . bisoprolol-hydrochlorothiazide (ZIAC) 5-6.25 MG tablet TAKE 1 TABLET BY MOUTH ONCE DAILY FOR BLOOD PRESSURE. 90 tablet 1   . cetirizine (ZYRTEC) 10 MG tablet Take 10 mg by mouth daily.    . Cholecalciferol (VITAMIN D PO) Take 2,000 Units by mouth 3 (three) times daily.    . Coenzyme Q10 (COQ10 PO) Take 1 capsule by mouth daily.    Marland Kitchen. escitalopram (LEXAPRO) 20 MG tablet TAKE 1 TABLET BY MOUTH DAILY FOR MOOD 90 tablet 0  . Flaxseed, Linseed, (FLAXSEED OIL PO) Take 1 capsule by mouth daily.    . furosemide (LASIX) 40 MG tablet TAKE 1 TABLET BY MOUTH DAILY 90 tablet 1  . KRILL OIL PO Take 1 capsule by mouth daily.    . Multiple Vitamins-Minerals (MULTIVITAMIN PO) Take by mouth 2 (two) times daily.    Marland Kitchen. OVER THE COUNTER MEDICATION Calcium 2 tabs per day    . OVER THE COUNTER MEDICATION 1 capsule daily. Tumeric    . pravastatin (PRAVACHOL) 40 MG tablet TAKE 1 TABLET BY MOUTH ONCE DAILY. 90 tablet 1  . prednisoLONE acetate (PRED FORTE) 1 % ophthalmic suspension   1   No current facility-administered medications on file prior to visit.     Allergies  Allergen Reactions  . Ppd [Tuberculin Purified Protein Derivative]     Current Problems (verified) Patient Active Problem List   Diagnosis Date Noted  . Depression 01/28/2015  . Medication management 10/03/2014  . Encounter for Medicare annual wellness exam 10/03/2014  . Obesity 03/21/2014  . Recurrent UTI 03/21/2014  . Hypertension   . Hyperlipidemia   . Vitamin D deficiency   . Prediabetes     Screening Tests Immunization History  Administered Date(s) Administered  . DT 01/08/2015  . Hepatitis B 03/16/2001, 09/22/2001  . Influenza, High Dose Seasonal PF 11/13/2013, 01/08/2015, 12/25/2015  . Pneumococcal Conjugate-13 07/23/2016  . Pneumococcal Polysaccharide-23 12/25/2010  . Td 06/17/2004  . Zoster 07/08/2010   Preventative care: Tetanus: 01/08/15 Pneumovax:12/25/2010 Prevnar 13: 2014 at [facility] Flu vaccine: 2017 Zostavax:07/08/10  MGM: 02/2015, due DEXA: 2016 Colonoscopy: 10/06/13 CXR 2013  Names of Other Physician/Practitioners you  currently use: 1. [facility] here for primary care 2. Cresenciano GenreSandy Cohen, eye doctor, last visit 12/2015 has OV Patient Care Team: Lucky CowboyMcKeown, William, MD as PCP - General (Internal Medicine) Vida RiggerMagod, Marc, MD as Consulting Physician (Gastroenterology) Elpidio Galeaohen, Sandya, MD as Consulting Physician (Ophthalmology) Sallye LatGroat, Christopher, MD as Consulting Physician (Optometry) Arminda ResidesJones, Daniel, MD as Consulting Physician (Dermatology)  SURGICAL HISTORY She  has a past surgical history that includes Tonsillectomy and adenoidectomy and Eye surgery (Left, 11/2012). FAMILY HISTORY Her family history includes Diabetes in her father; Heart disease in her mother; Hyperlipidemia in her mother; Hypertension in her father. SOCIAL HISTORY She  reports that she has never smoked. She has never used smokeless tobacco.   MEDICARE WELLNESS OBJECTIVES: Physical activity: Current Exercise Habits: The patient does not participate in regular exercise at present Cardiac risk factors: Cardiac Risk Factors include: advanced age (>755men, 71>65 women);dyslipidemia;hypertension;sedentary lifestyle;obesity (BMI >30kg/m2);family history of premature cardiovascular disease Depression/mood screen:   Depression  screen PHQ 2/9 07/23/2016  Decreased Interest 0  Down, Depressed, Hopeless 0  PHQ - 2 Score 0  Altered sleeping -  Tired, decreased energy -  Change in appetite -  Feeling bad or failure about yourself  -  Trouble concentrating -  Moving slowly or fidgety/restless -  Suicidal thoughts -  PHQ-9 Score -  Difficult doing work/chores -    ADLs:  In your present state of health, do you have any difficulty performing the following activities: 07/23/2016  Hearing? N  Vision? N  Difficulty concentrating or making decisions? N  Walking or climbing stairs? N  Dressing or bathing? N  Doing errands, shopping? N  Some recent data might be hidden     Cognitive Testing  Alert? Yes  Normal  Appearance?Yes  Oriented to person? Yes  Place? Yes   Time? Yes  Recall of three objects?  Yes  Can perform simple calculations? Yes  Displays appropriate judgment?Yes  Can read the correct time from a watch face?Yes  EOL planning: Does Patient Have a Medical Advance Directive?: Yes Type of Advance Directive: Healthcare Power of Attorney, Living will Copy of Healthcare Power of Attorney in Chart?: No - copy requested  Review of Systems  Constitutional: Negative for chills, fever and malaise/fatigue.  HENT: Negative for congestion, ear pain and sore throat.   Eyes: Negative.   Respiratory: Negative for cough, shortness of breath and wheezing.   Cardiovascular: Negative for chest pain, palpitations and leg swelling.  Gastrointestinal: Negative for abdominal pain, blood in stool, constipation, diarrhea, heartburn and melena.  Genitourinary: Negative.   Skin: Negative.   Neurological: Negative for dizziness, sensory change, loss of consciousness and headaches.  Psychiatric/Behavioral: Negative for depression. The patient is not nervous/anxious and does not have insomnia.      Objective:     Today's Vitals   07/23/16 1452  BP: 128/82  Pulse: 82  Resp: 16  Temp: 97.3 F (36.3 C)  SpO2: 95%  Weight: 178 lb (80.7 kg)  Height: 5' 3.5" (1.613 m)  PainSc: 0-No pain   Body mass index is 31.04 kg/m.  General appearance: alert, no distress, WD/WN, female HEENT: normocephalic, sclerae anicteric, TMs pearly, nares patent, no discharge or erythema, pharynx normal Oral cavity: MMM, no lesions Neck: supple, no lymphadenopathy, no thyromegaly, no masses Heart: RRR, normal S1, S2, no murmurs Lungs: CTA bilaterally, no wheezes, rhonchi, or rales Abdomen: +bs, soft, non tender, non distended, no masses, no hepatomegaly, no splenomegaly Musculoskeletal: nontender, no swelling, no obvious deformity Extremities: no edema, no cyanosis, no clubbing, right great toenail thick, brittle, no  warmth, erythema Pulses: 2+ symmetric, upper and lower extremities, normal cap refill Neurological: alert, oriented x 3, CN2-12 intact, strength normal upper extremities and lower extremities, sensation normal throughout, DTRs 2+ throughout, no cerebellar signs, gait normal Psychiatric: normal affect, behavior normal, pleasant   Medicare Attestation I have personally reviewed: The patient's medical and social history Their use of alcohol, tobacco or illicit drugs Their current medications and supplements The patient's functional ability including ADLs,fall risks, home safety risks, cognitive, and hearing and visual impairment Diet and physical activities Evidence for depression or mood disorders  The patient's weight, height, BMI, and visual acuity have been recorded in the chart.  I have made referrals, counseling, and provided education to the patient based on review of the above and I have provided the patient with a written personalized care plan for preventive services.     Quentin Mulling, PA-C   07/23/2016

## 2016-07-23 ENCOUNTER — Ambulatory Visit (INDEPENDENT_AMBULATORY_CARE_PROVIDER_SITE_OTHER): Payer: PPO | Admitting: Physician Assistant

## 2016-07-23 ENCOUNTER — Encounter: Payer: Self-pay | Admitting: Physician Assistant

## 2016-07-23 VITALS — BP 128/82 | HR 82 | Temp 97.3°F | Resp 16 | Ht 63.5 in | Wt 178.0 lb

## 2016-07-23 DIAGNOSIS — I10 Essential (primary) hypertension: Secondary | ICD-10-CM

## 2016-07-23 DIAGNOSIS — Z0000 Encounter for general adult medical examination without abnormal findings: Secondary | ICD-10-CM

## 2016-07-23 DIAGNOSIS — B0230 Zoster ocular disease, unspecified: Secondary | ICD-10-CM

## 2016-07-23 DIAGNOSIS — R7303 Prediabetes: Secondary | ICD-10-CM | POA: Diagnosis not present

## 2016-07-23 DIAGNOSIS — Z23 Encounter for immunization: Secondary | ICD-10-CM

## 2016-07-23 DIAGNOSIS — Z0001 Encounter for general adult medical examination with abnormal findings: Secondary | ICD-10-CM

## 2016-07-23 DIAGNOSIS — E782 Mixed hyperlipidemia: Secondary | ICD-10-CM

## 2016-07-23 DIAGNOSIS — R6889 Other general symptoms and signs: Secondary | ICD-10-CM | POA: Diagnosis not present

## 2016-07-23 DIAGNOSIS — E6609 Other obesity due to excess calories: Secondary | ICD-10-CM

## 2016-07-23 DIAGNOSIS — B351 Tinea unguium: Secondary | ICD-10-CM | POA: Diagnosis not present

## 2016-07-23 DIAGNOSIS — E559 Vitamin D deficiency, unspecified: Secondary | ICD-10-CM | POA: Diagnosis not present

## 2016-07-23 DIAGNOSIS — B0239 Other herpes zoster eye disease: Secondary | ICD-10-CM

## 2016-07-23 DIAGNOSIS — N390 Urinary tract infection, site not specified: Secondary | ICD-10-CM

## 2016-07-23 DIAGNOSIS — F3341 Major depressive disorder, recurrent, in partial remission: Secondary | ICD-10-CM

## 2016-07-23 DIAGNOSIS — Z79899 Other long term (current) drug therapy: Secondary | ICD-10-CM

## 2016-07-23 DIAGNOSIS — Z6831 Body mass index (BMI) 31.0-31.9, adult: Secondary | ICD-10-CM

## 2016-07-23 LAB — CBC WITH DIFFERENTIAL/PLATELET
BASOS ABS: 77 {cells}/uL (ref 0–200)
Basophils Relative: 1 %
EOS PCT: 2 %
Eosinophils Absolute: 154 cells/uL (ref 15–500)
HEMATOCRIT: 45.9 % — AB (ref 35.0–45.0)
HEMOGLOBIN: 15.7 g/dL — AB (ref 11.7–15.5)
LYMPHS ABS: 2310 {cells}/uL (ref 850–3900)
Lymphocytes Relative: 30 %
MCH: 33.2 pg — ABNORMAL HIGH (ref 27.0–33.0)
MCHC: 34.2 g/dL (ref 32.0–36.0)
MCV: 97 fL (ref 80.0–100.0)
MPV: 9.2 fL (ref 7.5–12.5)
Monocytes Absolute: 770 cells/uL (ref 200–950)
Monocytes Relative: 10 %
NEUTROS ABS: 4389 {cells}/uL (ref 1500–7800)
NEUTROS PCT: 57 %
Platelets: 301 10*3/uL (ref 140–400)
RBC: 4.73 MIL/uL (ref 3.80–5.10)
RDW: 13.1 % (ref 11.0–15.0)
WBC: 7.7 10*3/uL (ref 3.8–10.8)

## 2016-07-23 LAB — TSH: TSH: 1.4 mIU/L

## 2016-07-23 MED ORDER — LORAZEPAM 2 MG PO TABS: ORAL_TABLET | ORAL | 1 refills | Status: DC

## 2016-07-23 NOTE — Patient Instructions (Signed)
Fungal Nail Infection Fungal nail infection is a common fungal infection of the toenails or fingernails. This condition affects toenails more often than fingernails. More than one nail may be infected. The condition can be passed from person to person (is contagious). What are the causes? This condition is caused by a fungus. Several types of funguses can cause the infection. These funguses are common in moist and warm areas. If your hands or feet come into contact with the fungus, it may get into a crack in your fingernail or toenail and cause the infection. What increases the risk? The following factors may make you more likely to develop this condition:  Being female.  Having diabetes.  Being of older age.  Living with someone who has the fungus.  Walking barefoot in areas where the fungus thrives, such as showers or locker rooms.  Having poor circulation.  Wearing shoes and socks that cause your feet to sweat.  Having athlete's foot.  Having a nail injury or history of a recent nail surgery.  Having psoriasis.  Having a weak body defense system (immune system). What are the signs or symptoms? Symptoms of this condition include:  A pale spot on the nail.  Thickening of the nail.  A nail that becomes yellow or brown.  A brittle or ragged nail edge.  A crumbling nail.  A nail that has lifted away from the nail bed. How is this diagnosed? This condition is diagnosed with a physical exam. Your health care provider may take a scraping or clipping from your nail to test for the fungus. How is this treated? Mild infections do not need treatment. If you have significant nail changes, treatment may include:  Oral antifungal medicines. You may need to take the medicine for several weeks or several months, and you may not see the results for a long time. These medicines can cause side effects. Ask your health care provider what problems to watch for.  Antifungal nail polish  and nail cream. These may be used along with oral antifungal medicines.  Laser treatment of the nail.  Surgery to remove the nail. This may be needed for the most severe infections. Treatment takes a long time, and the infection may come back. Follow these instructions at home: Medicines   Take or apply over-the-counter and prescription medicines only as told by your health care provider.  Ask your health care provider about using over-the-counter mentholated ointment on your nails. Lifestyle    Do not share personal items, such as towels or nail clippers.  Trim your nails often.  Wash and dry your hands and feet every day.  Wear absorbent socks, and change your socks frequently.  Wear shoes that allow air to circulate, such as sandals or canvas tennis shoes. Throw out old shoes.  Wear rubber gloves if you are working with your hands in wet areas.  Do not walk barefoot in shower rooms or locker rooms.  Do not use a nail salon that does not use clean instruments.  Do not use artificial nails. General instructions   Keep all follow-up visits as told by your health care provider. This is important.  Use antifungal foot powder on your feet and in your shoes. Contact a health care provider if: Your infection is not getting better or it is getting worse after several months. This information is not intended to replace advice given to you by your health care provider. Make sure you discuss any questions you have with your health  care provider. Document Released: 02/28/2000 Document Revised: 08/08/2015 Document Reviewed: 09/03/2014 Elsevier Interactive Patient Education  2017 [employer].   Simple math prevails.    1st - exercise does not produce significant weight loss - at best one converts fat into muscle , "bulks up", loses inches, but usually stays "weight neutral"     2nd - think of your body weightas a check book: If you eat more calories than you burn up - you save  money or gain weight .... Or if you spend more money than you put in the check book, ie burn up more calories than you eat, then you lose weight     3rd - if you walk or run 1 mile, you burn up 100 calories - you have to burn up 3,500 calories to lose 1 pound, ie you have to walk/run 35 miles to lose 1 measly pound. So if you want to lose 10 #, then you have to walk/run 350 miles, so.... clearly exercise is not the solution.     4. So if you consume 1,500 calories, then you have to burn up the equivalent of 15 miles to stay weight neutral - It also stands to reason that if you consume 1,500 cal/day and don't lose weight, then you must be burning up about 1,500 cals/day to stay weight neutral.     5. If you really want to lose weight, you must cut your calorie intake 300 calories /day and at that rate you should lose about 1 # every 3 days.   6. Please purchase Dr Francis Dowse Fuhrman's book(s) "The End of Dieting" & "Eat to Live" . It has some great concepts and recipes.

## 2016-07-24 LAB — BASIC METABOLIC PANEL WITH GFR
BUN: 13 mg/dL (ref 7–25)
CHLORIDE: 102 mmol/L (ref 98–110)
CO2: 25 mmol/L (ref 20–31)
Calcium: 9.3 mg/dL (ref 8.6–10.4)
Creat: 0.93 mg/dL (ref 0.60–0.93)
GFR, EST NON AFRICAN AMERICAN: 62 mL/min (ref >=60)
GFR, Est African American: 72 mL/min (ref >=60)
GLUCOSE: 92 mg/dL (ref 65–99)
POTASSIUM: 3.8 mmol/L (ref 3.5–5.3)
Sodium: 139 mmol/L (ref 135–146)

## 2016-07-24 LAB — LIPID PANEL
Cholesterol: 173 mg/dL (ref <200)
HDL: 45 mg/dL — AB (ref >50)
LDL CALC: 72 mg/dL (ref <100)
Total CHOL/HDL Ratio: 3.8 Ratio (ref <5.0)
Triglycerides: 278 mg/dL — ABNORMAL HIGH (ref <150)
VLDL: 56 mg/dL — AB (ref <30)

## 2016-07-24 LAB — HEPATIC FUNCTION PANEL
ALK PHOS: 67 U/L (ref 33–130)
ALT: 21 U/L (ref 6–29)
AST: 31 U/L (ref 10–35)
Albumin: 4 g/dL (ref 3.6–5.1)
BILIRUBIN INDIRECT: 0.5 mg/dL (ref 0.2–1.2)
Bilirubin, Direct: 0.1 mg/dL (ref <=0.2)
TOTAL PROTEIN: 7.1 g/dL (ref 6.1–8.1)
Total Bilirubin: 0.6 mg/dL (ref 0.2–1.2)

## 2016-07-24 LAB — MAGNESIUM: MAGNESIUM: 1.9 mg/dL (ref 1.5–2.5)

## 2016-07-24 LAB — HEMOGLOBIN A1C
HEMOGLOBIN A1C: 5.9 % — AB (ref <5.7)
Mean Plasma Glucose: 123 mg/dL

## 2016-07-24 NOTE — Progress Notes (Signed)
LVM for pt to return office call for LAB results.

## 2016-07-30 ENCOUNTER — Other Ambulatory Visit: Payer: Self-pay | Admitting: Physician Assistant

## 2016-07-30 MED ORDER — TERBINAFINE HCL 250 MG PO TABS: 250.0000 mg | ORAL_TABLET | Freq: Every day | ORAL | 0 refills | Status: DC

## 2016-08-19 ENCOUNTER — Other Ambulatory Visit: Payer: Self-pay | Admitting: Physician Assistant

## 2016-09-18 ENCOUNTER — Ambulatory Visit (INDEPENDENT_AMBULATORY_CARE_PROVIDER_SITE_OTHER): Payer: PPO | Admitting: Physician Assistant

## 2016-09-18 ENCOUNTER — Encounter: Payer: Self-pay | Admitting: Physician Assistant

## 2016-09-18 VITALS — BP 128/72 | HR 72 | Temp 97.3°F | Resp 14 | Ht 63.5 in | Wt 178.6 lb

## 2016-09-18 DIAGNOSIS — B351 Tinea unguium: Secondary | ICD-10-CM

## 2016-09-18 DIAGNOSIS — Z79899 Other long term (current) drug therapy: Secondary | ICD-10-CM | POA: Diagnosis not present

## 2016-09-18 MED ORDER — TERBINAFINE HCL 250 MG PO TABS: 250.0000 mg | ORAL_TABLET | Freq: Every day | ORAL | 1 refills | Status: AC

## 2016-09-18 NOTE — Progress Notes (Signed)
H&P 71 y.o. female presents to the office with ingrown toe nail with pain and , erythema. Has had for several years. Denies fever, chills.   Medications: Current Outpatient Prescriptions on File Prior to Visit  Medication Sig Dispense Refill  . acyclovir (ZOVIRAX) 400 MG tablet Take 400 mg by mouth 2 (two) times daily.    Marland Kitchen. ALPRAZolam (XANAX) 0.5 MG tablet Take 1 tablet (0.5 mg total) by mouth at bedtime as needed for anxiety. 90 tablet 1  . aspirin 81 MG tablet Take 81 mg by mouth every other day.    . bisoprolol-hydrochlorothiazide (ZIAC) 5-6.25 MG tablet TAKE 1 TABLET BY MOUTH ONCE DAILY FOR BLOOD PRESSURE. 90 tablet 1  . cetirizine (ZYRTEC) 10 MG tablet Take 10 mg by mouth daily.    . Cholecalciferol (VITAMIN D PO) Take 2,000 Units by mouth 3 (three) times daily.    . Coenzyme Q10 (COQ10 PO) Take 1 capsule by mouth daily.    Marland Kitchen. escitalopram (LEXAPRO) 20 MG tablet TAKE 1 TABLET BY MOUTH DAILY FOR MOOD 90 tablet 0  . furosemide (LASIX) 40 MG tablet TAKE 1 TABLET BY MOUTH DAILY 90 tablet 1  . LORazepam (ATIVAN) 2 MG tablet TAKE 1/2 TO 1 TABLET BY MOUTH NIGHTLY ATBEDTIME. 90 tablet 1  . Multiple Vitamins-Minerals (MULTIVITAMIN PO) Take by mouth 2 (two) times daily.    Marland Kitchen. OVER THE COUNTER MEDICATION Calcium 2 tabs per day    . OVER THE COUNTER MEDICATION 1 capsule daily. Tumeric    . pravastatin (PRAVACHOL) 40 MG tablet TAKE 1 TABLET BY MOUTH ONCE DAILY. 90 tablet 1  . prednisoLONE acetate (PRED FORTE) 1 % ophthalmic suspension   1  . terbinafine (LAMISIL) 250 MG tablet Take 1 tablet (250 mg total) by mouth daily. Take one daily for 3 months, need liver function lab at 6 weeks. 90 tablet 0   No current facility-administered medications on file prior to visit.     Problem list Patient Active Problem List   Diagnosis Date Noted  . Depression 01/28/2015  . Medication management 10/03/2014  . Encounter for Medicare annual wellness exam 10/03/2014  . Obesity 03/21/2014  . Recurrent UTI  03/21/2014  . Hypertension   . Hyperlipidemia   . Vitamin D deficiency   . Prediabetes     Physical Exam Blood pressure 128/72, pulse 72, temperature (!) 97.3 F (36.3 C), resp. rate 14, height 5' 3.5" (1.613 m), weight 178 lb 9.6 oz (81 kg), SpO2 97 %. alert bunion on both sides, tinea pedis, nail exam onychomycosis of the toenails and dystrophic nails and foot and ankle exam otherwise normal  PROCEDURE NOTE: Ingrown Toenail Removal  Verbal Consent Obtained.  wiped with alcohol prep pad, then digital block with 3cc of 1% lidocaine  left lateral nail lifted and excised, medial aspect of nail left intact.  Proximal aspect of nail bed explored revealing no nail remnants. Ingrown tissue debrided. Still active bleeding. Dressing applied. Cleansed and dressed.  Assessment and Plan: Ingrown toenail and onychomycosis  Continue lamisil, check LFTs today and in 6-8 weeks Wound care instructions including precautions reviewed with patient. DX: [id number] complete nail removal ingrown toenail           No future appointments.

## 2016-09-18 NOTE — Patient Instructions (Signed)
Fingernail or Toenail Removal, Adult, Care After  This sheet gives you information about how to care for yourself after your procedure. Your health care provider may also give you more specific instructions. If you have problems or questions, contact your health care provider.  What can I expect after the procedure?  After the procedure, it is common to have:  · Pain.  · Redness.  · Swelling.  · Soreness.    Follow these instructions at home:  · If you have a splint:  ? Do not put pressure on any part of the splint until it is fully hardened. This may take several hours.  ? Wear the splint as told by your health care provider. Remove it only as told by your health care provider.  ? Loosen the splint if your fingers or toes tingle, become numb, or turn cold and blue.  ? Keep the splint clean.  ? If the splint is not waterproof:  § Do not let it get wet.  § Cover it with a watertight covering when you take a bath or a shower.  Wound care    · Follow instructions from your health care provider about how to take care of your wound. Make sure you:  ? Wash your hands with soap and water before you change your bandage (dressing). If soap and water are not available, use hand sanitizer.  ? Change your dressing as told by your health care provider.  ? Keep your dressing dry until your health care provider says it can be removed.  ? Leave stitches (sutures), skin glue, or adhesive strips in place. These skin closures may need to stay in place for 2 weeks or longer. If adhesive strip edges start to loosen and curl up, you may trim the loose edges. Do not remove adhesive strips completely unless your health care provider tells you to do that.  · Check your wound every day for signs of infection. Check for:  ? More redness, swelling, or pain.  ? More fluid or blood.  ? Warmth.  ? Pus or a bad smell.  Managing pain, stiffness, and swelling  · Move your fingers or toes often to avoid stiffness and to lessen swelling.  · Raise  (elevate) the injured area above the level of your heart while you are sitting or lying down. You may need to keep your finger or toe raised or supported on a pillow for 24 hours or as told by your health care provider.  · Soak your hand or foot in warm, soapy water for 10-20 minutes, 3 times a day or as told by your health care provider.  Medicine  · Take over-the-counter and prescription medicines only as told by your health care provider.  · If you were prescribed an antibiotic medicine, use it as told by your health care provider. Do not stop using the antibiotic even if your condition improves.  General instructions  · If you were given a shoe to wear, wear it as told by your health care provider.  · Keep all follow-up visits as told by your health care provider. This is important.  Contact a health care provider if:  · You have more redness, swelling, or pain around your wound.  · You have more fluid or blood coming from your wound.  · Your wound feels warm to the touch.  · You have pus or a bad smell coming from your wound.  · You have a fever.  · Your   finger or toe looks blue or black.  This information is not intended to replace advice given to you by your health care provider. Make sure you discuss any questions you have with your health care provider.  Document Released: 03/23/2014 Document Revised: 10/30/2015 Document Reviewed: 09/09/2015  Elsevier Interactive Patient Education © 2018 [employer] Inc[employer]

## 2016-09-19 LAB — HEPATIC FUNCTION PANEL
ALK PHOS: 82 U/L (ref 33–130)
ALT: 22 U/L (ref 6–29)
AST: 29 U/L (ref 10–35)
Albumin: 3.9 g/dL (ref 3.6–5.1)
BILIRUBIN DIRECT: 0.1 mg/dL (ref <=0.2)
BILIRUBIN INDIRECT: 0.4 mg/dL (ref 0.2–1.2)
Total Bilirubin: 0.5 mg/dL (ref 0.2–1.2)
Total Protein: 7.1 g/dL (ref 6.1–8.1)

## 2016-09-21 ENCOUNTER — Telehealth: Payer: Self-pay

## 2016-09-21 NOTE — Progress Notes (Signed)
Pt aware of lab results & voiced understanding of those results.  Pt would like to know how long should she keep the bandage on her toe? When should she stop wrapping her toe?

## 2016-09-21 NOTE — Telephone Encounter (Signed)
Pt called wanting to know how she should be taking care of her foot.   Per provider pt should continue to dress toe for 1 wk & if she has any concerns or questions she is welcome to schedule a ov.  Pt seemed very happy with that information & hung up

## 2016-09-30 DIAGNOSIS — H2511 Age-related nuclear cataract, right eye: Secondary | ICD-10-CM | POA: Diagnosis not present

## 2016-09-30 DIAGNOSIS — H17822 Peripheral opacity of cornea, left eye: Secondary | ICD-10-CM | POA: Diagnosis not present

## 2016-09-30 DIAGNOSIS — H1712 Central corneal opacity, left eye: Secondary | ICD-10-CM | POA: Diagnosis not present

## 2016-09-30 DIAGNOSIS — H16302 Unspecified interstitial keratitis, left eye: Secondary | ICD-10-CM | POA: Diagnosis not present

## 2016-09-30 DIAGNOSIS — H04123 Dry eye syndrome of bilateral lacrimal glands: Secondary | ICD-10-CM | POA: Diagnosis not present

## 2016-09-30 DIAGNOSIS — Z961 Presence of intraocular lens: Secondary | ICD-10-CM | POA: Diagnosis not present

## 2016-10-02 ENCOUNTER — Other Ambulatory Visit: Payer: Self-pay | Admitting: Internal Medicine

## 2016-11-11 ENCOUNTER — Other Ambulatory Visit: Payer: Self-pay | Admitting: Physician Assistant

## 2016-11-11 ENCOUNTER — Other Ambulatory Visit: Payer: Self-pay

## 2016-11-11 MED ORDER — FUROSEMIDE 40 MG PO TABS: 40.0000 mg | ORAL_TABLET | Freq: Every day | ORAL | 0 refills | Status: DC

## 2016-11-13 ENCOUNTER — Other Ambulatory Visit: Payer: PPO

## 2016-11-13 ENCOUNTER — Other Ambulatory Visit: Payer: Self-pay | Admitting: *Deleted

## 2016-11-13 DIAGNOSIS — B351 Tinea unguium: Secondary | ICD-10-CM

## 2016-11-13 MED ORDER — FUROSEMIDE 40 MG PO TABS: 40.0000 mg | ORAL_TABLET | Freq: Every day | ORAL | 0 refills | Status: DC

## 2016-11-14 LAB — HEPATIC FUNCTION PANEL
AG RATIO: 1.3 (calc) (ref 1.0–2.5)
ALKALINE PHOSPHATASE (APISO): 76 U/L (ref 33–130)
ALT: 27 U/L (ref 6–29)
AST: 35 U/L (ref 10–35)
Albumin: 3.9 g/dL (ref 3.6–5.1)
BILIRUBIN DIRECT: 0.1 mg/dL (ref 0.0–0.2)
BILIRUBIN INDIRECT: 0.3 mg/dL (ref 0.2–1.2)
BILIRUBIN TOTAL: 0.4 mg/dL (ref 0.2–1.2)
GLOBULIN: 3 g/dL (ref 1.9–3.7)
TOTAL PROTEIN: 6.9 g/dL (ref 6.1–8.1)

## 2016-11-17 ENCOUNTER — Other Ambulatory Visit: Payer: Self-pay | Admitting: Physician Assistant

## 2016-11-17 MED ORDER — FUROSEMIDE 40 MG PO TABS: 40.0000 mg | ORAL_TABLET | Freq: Every day | ORAL | 0 refills | Status: DC

## 2016-11-17 NOTE — Progress Notes (Signed)
Pt aware of lab results & voiced understanding of those results.

## 2016-12-23 ENCOUNTER — Other Ambulatory Visit: Payer: Self-pay | Admitting: Internal Medicine

## 2016-12-23 DIAGNOSIS — F3341 Major depressive disorder, recurrent, in partial remission: Secondary | ICD-10-CM

## 2016-12-23 NOTE — Telephone Encounter (Signed)
Please call Lorazepam 

## 2017-01-12 ENCOUNTER — Encounter: Payer: Self-pay | Admitting: Internal Medicine

## 2017-01-13 ENCOUNTER — Ambulatory Visit (INDEPENDENT_AMBULATORY_CARE_PROVIDER_SITE_OTHER): Payer: PPO

## 2017-01-13 DIAGNOSIS — Z1231 Encounter for screening mammogram for malignant neoplasm of breast: Secondary | ICD-10-CM | POA: Diagnosis not present

## 2017-01-13 DIAGNOSIS — Z23 Encounter for immunization: Secondary | ICD-10-CM | POA: Diagnosis not present

## 2017-01-13 LAB — HM MAMMOGRAPHY

## 2017-02-02 ENCOUNTER — Encounter: Payer: Self-pay | Admitting: *Deleted

## 2017-02-08 ENCOUNTER — Encounter: Payer: Self-pay | Admitting: Internal Medicine

## 2017-02-17 ENCOUNTER — Other Ambulatory Visit: Payer: Self-pay | Admitting: Internal Medicine

## 2017-03-16 NOTE — Progress Notes (Deleted)
CPE AND 3 month follow up  Assessment:   Essential hypertension - continue medications, DASH diet, exercise and monitor at home. Call if greater than 130/80.  -     CBC with Differential/Platelet -     BASIC METABOLIC PANEL WITH GFR -     Hepatic function panel -     TSH  Mixed hyperlipidemia -continue medications, check lipids, decrease fatty foods, increase activity.  -     Lipid panel  Prediabetes Discussed general issues about diabetes pathophysiology and management., Educational material distributed., Suggested low cholesterol diet., Encouraged aerobic exercise., Discussed foot care., Reminded to get yearly retinal exam. -     Hemoglobin A1c  Vitamin D deficiency Continue supplement  Medication management -     Magnesium  Recurrent major depressive disorder, in partial remission (HCC) - continue medications, stress management techniques discussed, increase water, good sleep hygiene discussed, increase exercise, and increase veggies.  -     LORazepam (ATIVAN) 2 MG tablet; TAKE 1/2 TO 1 TABLET BY MOUTH NIGHTLY ATBEDTIME.  Recurrent UTI monitor  Class 1 obesity due to excess calories with serious comorbidity and body mass index (BMI) of 31.0 to 31.9 in adult - long discussion about weight loss, diet, and exercise  Ocular herpes zoster Continue follow up eye doctor  Over 40 minutes of exam, counseling, chart review and critical decision making was performed Future Appointments  Date Time Provider Department Center  03/18/2017 11:00 AM Quentin Mulling, PA-C [facility] None    Subjective:  Allison Barker is a 72 y.o. female who presents for CPE and follow up.    Her blood pressure has been controlled at home, today their BP is   She does not workout. She denies chest pain, shortness of breath, dizziness.  She is on cholesterol medication and denies myalgias. Her cholesterol is at goal. The cholesterol last visit was:   Lab Results  Component Value Date   CHOL 173  07/23/2016   HDL 45 (L) 07/23/2016   LDLCALC 72 07/23/2016   TRIG 278 (H) 07/23/2016   CHOLHDL 3.8 07/23/2016   Last B1Y normal range, but has been in the preDM range in the past.  Lab Results  Component Value Date   HGBA1C 5.9 (H) 07/23/2016   Last GFR: Lab Results  Component Value Date   GFRNONAA 62 07/23/2016   Patient is on Vitamin D supplement.   Lab Results  Component Value Date   VD25OH 48 02/05/2016     BMI is There is no height or weight on file to calculate BMI., she is working on diet and exercise. Wt Readings from Last 3 Encounters:  09/18/16 178 lb 9.6 oz (81 kg)  07/23/16 178 lb (80.7 kg)  02/05/16 180 lb (81.6 kg)   She has ocular herpes, on acyclovir for this and follows Dr. Noel Gerold every 6 months.    Medication Review: Current Outpatient Medications on File Prior to Visit  Medication Sig Dispense Refill  . acyclovir (ZOVIRAX) 400 MG tablet Take 400 mg by mouth 2 (two) times daily.    Marland Kitchen ALPRAZolam (XANAX) 0.5 MG tablet Take 1 tablet (0.5 mg total) by mouth at bedtime as needed for anxiety. 90 tablet 1  . aspirin 81 MG tablet Take 81 mg by mouth every other day.    . bisoprolol-hydrochlorothiazide (ZIAC) 5-6.25 MG tablet TAKE 1 TABLET BY MOUTH ONCE DAILY FOR BLOOD PRESSURE 90 tablet 1  . cetirizine (ZYRTEC) 10 MG tablet Take 10 mg by mouth daily.    Marland Kitchen  Cholecalciferol (VITAMIN D PO) Take 2,000 Units by mouth 3 (three) times daily.    . Coenzyme Q10 (COQ10 PO) Take 1 capsule by mouth daily.    Marland Kitchen escitalopram (LEXAPRO) 20 MG tablet TAKE 1 TABLET BY MOUTH DAILY FOR MOOD 90 tablet 0  . furosemide (LASIX) 40 MG tablet Take 1 tablet (40 mg total) by mouth daily. 90 tablet 0  . LORazepam (ATIVAN) 2 MG tablet TAKE 1/2 TO 1 TABLET BY MOUTH NIGHTLY ATBEDTIME 90 tablet 1  . Multiple Vitamins-Minerals (MULTIVITAMIN PO) Take by mouth 2 (two) times daily.    Marland Kitchen OVER THE COUNTER MEDICATION Calcium 2 tabs per day    . OVER THE COUNTER MEDICATION 1 capsule daily. Tumeric    .  pravastatin (PRAVACHOL) 40 MG tablet TAKE 1 TABLET BY MOUTH ONCE DAILY. 90 tablet 1  . prednisoLONE acetate (PRED FORTE) 1 % ophthalmic suspension   1   No current facility-administered medications on file prior to visit.     Allergies  Allergen Reactions  . Ppd [Tuberculin Purified Protein Derivative]     Current Problems (verified) Patient Active Problem List   Diagnosis Date Noted  . Depression 01/28/2015  . Medication management 10/03/2014  . Encounter for Medicare annual wellness exam 10/03/2014  . Obesity 03/21/2014  . Recurrent UTI 03/21/2014  . Hypertension   . Hyperlipidemia   . Vitamin D deficiency   . Prediabetes     Screening Tests Immunization History  Administered Date(s) Administered  . DT 01/08/2015  . Hepatitis B 03/16/2001, 09/22/2001  . Influenza, High Dose Seasonal PF 11/13/2013, 01/08/2015, 12/25/2015, 01/13/2017  . Pneumococcal Conjugate-13 07/23/2016  . Pneumococcal Polysaccharide-23 12/25/2010  . Td 06/17/2004  . Zoster 07/08/2010   Preventative care: Tetanus: 01/08/15 Pneumovax:12/25/2010 Prevnar 13: 2014 at [facility] Flu vaccine: 2017 Zostavax:07/08/10  MGM: 02/2015, due DEXA: 2016 Colonoscopy: 10/06/13 CXR 2013  Names of Other Physician/Practitioners you currently use: 1. [facility] here for primary care 2. Cresenciano Genre, eye doctor, last visit 12/2015 has OV Patient Care Team: Lucky Cowboy, MD as PCP - General (Internal Medicine) Vida Rigger, MD as Consulting Physician (Gastroenterology) Elpidio Galea, MD as Consulting Physician (Ophthalmology) Sallye Lat, MD as Consulting Physician (Optometry) Arminda Resides, MD as Consulting Physician (Dermatology)  SURGICAL HISTORY She  has a past surgical history that includes Tonsillectomy and adenoidectomy and Eye surgery (Left, 11/2012). FAMILY HISTORY Her family history includes Diabetes in her father; Heart disease in her mother;  Hyperlipidemia in her mother; Hypertension in her father. SOCIAL HISTORY She  reports that  has never smoked. she has never used smokeless tobacco.   Review of Systems  Constitutional: Negative for chills, fever and malaise/fatigue.  HENT: Negative for congestion, ear pain and sore throat.   Eyes: Negative.   Respiratory: Negative for cough, shortness of breath and wheezing.   Cardiovascular: Negative for chest pain, palpitations and leg swelling.  Gastrointestinal: Negative for abdominal pain, blood in stool, constipation, diarrhea, heartburn and melena.  Genitourinary: Negative.   Skin: Negative.   Neurological: Negative for dizziness, sensory change, loss of consciousness and headaches.  Psychiatric/Behavioral: Negative for depression. The patient is not nervous/anxious and does not have insomnia.      Objective:     There were no vitals filed for this visit. There is no height or weight on file to calculate BMI.  General appearance: alert, no distress, WD/WN, female HEENT: normocephalic, sclerae anicteric, TMs pearly, nares patent, no discharge or erythema, pharynx normal Oral cavity: MMM,  no lesions Neck: supple, no lymphadenopathy, no thyromegaly, no masses Heart: RRR, normal S1, S2, no murmurs Lungs: CTA bilaterally, no wheezes, rhonchi, or rales Abdomen: +bs, soft, non tender, non distended, no masses, no hepatomegaly, no splenomegaly Musculoskeletal: nontender, no swelling, no obvious deformity Extremities: no edema, no cyanosis, no clubbing, right great toenail thick, brittle, no warmth, erythema Pulses: 2+ symmetric, upper and lower extremities, normal cap refill Neurological: alert, oriented x 3, CN2-12 intact, strength normal upper extremities and lower extremities, sensation normal throughout, DTRs 2+ throughout, no cerebellar signs, gait normal Psychiatric: normal affect, behavior normal, pleasant    Quentin Mullingmanda Vittorio Mohs, PA-C   03/16/2017

## 2017-03-18 ENCOUNTER — Encounter: Payer: Self-pay | Admitting: Physician Assistant

## 2017-03-22 DIAGNOSIS — H16302 Unspecified interstitial keratitis, left eye: Secondary | ICD-10-CM | POA: Diagnosis not present

## 2017-03-22 DIAGNOSIS — H17822 Peripheral opacity of cornea, left eye: Secondary | ICD-10-CM | POA: Diagnosis not present

## 2017-03-22 DIAGNOSIS — H2511 Age-related nuclear cataract, right eye: Secondary | ICD-10-CM | POA: Diagnosis not present

## 2017-03-22 DIAGNOSIS — Z961 Presence of intraocular lens: Secondary | ICD-10-CM | POA: Diagnosis not present

## 2017-03-22 DIAGNOSIS — H1712 Central corneal opacity, left eye: Secondary | ICD-10-CM | POA: Diagnosis not present

## 2017-03-22 DIAGNOSIS — H04123 Dry eye syndrome of bilateral lacrimal glands: Secondary | ICD-10-CM | POA: Diagnosis not present

## 2017-03-22 DIAGNOSIS — B0052 Herpesviral keratitis: Secondary | ICD-10-CM | POA: Diagnosis not present

## 2017-03-31 DIAGNOSIS — H16302 Unspecified interstitial keratitis, left eye: Secondary | ICD-10-CM | POA: Diagnosis not present

## 2017-03-31 DIAGNOSIS — Z961 Presence of intraocular lens: Secondary | ICD-10-CM | POA: Diagnosis not present

## 2017-03-31 DIAGNOSIS — H04123 Dry eye syndrome of bilateral lacrimal glands: Secondary | ICD-10-CM | POA: Diagnosis not present

## 2017-03-31 DIAGNOSIS — H2511 Age-related nuclear cataract, right eye: Secondary | ICD-10-CM | POA: Diagnosis not present

## 2017-03-31 DIAGNOSIS — H17822 Peripheral opacity of cornea, left eye: Secondary | ICD-10-CM | POA: Diagnosis not present

## 2017-03-31 DIAGNOSIS — H1712 Central corneal opacity, left eye: Secondary | ICD-10-CM | POA: Diagnosis not present

## 2017-04-21 ENCOUNTER — Ambulatory Visit (INDEPENDENT_AMBULATORY_CARE_PROVIDER_SITE_OTHER): Payer: PPO | Admitting: Physician Assistant

## 2017-04-21 ENCOUNTER — Encounter: Payer: Self-pay | Admitting: Physician Assistant

## 2017-04-21 VITALS — BP 128/76 | HR 114 | Temp 97.3°F | Ht 63.5 in | Wt 179.0 lb

## 2017-04-21 DIAGNOSIS — F3341 Major depressive disorder, recurrent, in partial remission: Secondary | ICD-10-CM

## 2017-04-21 DIAGNOSIS — Z6831 Body mass index (BMI) 31.0-31.9, adult: Secondary | ICD-10-CM | POA: Diagnosis not present

## 2017-04-21 DIAGNOSIS — R6889 Other general symptoms and signs: Secondary | ICD-10-CM

## 2017-04-21 DIAGNOSIS — N390 Urinary tract infection, site not specified: Secondary | ICD-10-CM | POA: Diagnosis not present

## 2017-04-21 DIAGNOSIS — E6609 Other obesity due to excess calories: Secondary | ICD-10-CM | POA: Diagnosis not present

## 2017-04-21 DIAGNOSIS — I10 Essential (primary) hypertension: Secondary | ICD-10-CM | POA: Diagnosis not present

## 2017-04-21 DIAGNOSIS — E559 Vitamin D deficiency, unspecified: Secondary | ICD-10-CM

## 2017-04-21 DIAGNOSIS — Z0001 Encounter for general adult medical examination with abnormal findings: Secondary | ICD-10-CM

## 2017-04-21 DIAGNOSIS — E782 Mixed hyperlipidemia: Secondary | ICD-10-CM

## 2017-04-21 DIAGNOSIS — J069 Acute upper respiratory infection, unspecified: Secondary | ICD-10-CM

## 2017-04-21 DIAGNOSIS — R7303 Prediabetes: Secondary | ICD-10-CM | POA: Diagnosis not present

## 2017-04-21 DIAGNOSIS — Z79899 Other long term (current) drug therapy: Secondary | ICD-10-CM

## 2017-04-21 DIAGNOSIS — Z0000 Encounter for general adult medical examination without abnormal findings: Secondary | ICD-10-CM

## 2017-04-21 MED ORDER — FLUTICASONE FUROATE-VILANTEROL 100-25 MCG/INH IN AEPB: 1.0000 | INHALATION_SPRAY | Freq: Every day | RESPIRATORY_TRACT | 0 refills | Status: DC

## 2017-04-21 MED ORDER — AZITHROMYCIN 250 MG PO TABS
ORAL_TABLET | ORAL | 1 refills | Status: AC
Start: 2017-04-21 — End: 2017-04-26

## 2017-04-21 MED ORDER — PRAVASTATIN SODIUM 40 MG PO TABS: 40.0000 mg | ORAL_TABLET | Freq: Every day | ORAL | 1 refills | Status: DC

## 2017-04-21 NOTE — Patient Instructions (Addendum)
Get toe separator for toes, can refer to podiatrist for hammer toe  Can do steroid inhaler, NEED TO DO DAILY, this is NOT a rescue inhaler so if you are acutely short of breath please use your albuterol or call 911.  Do 1 puff once a day.  Do before you brush your teeth OR wash your mouth afterwards.  IF YOU DO NOT WASH YOUR MOUTH OUT IT CAN CAUSE YEAST Can do 2 tsp vinegar with water and switch to help prevent yeast or help yeast in your mouth.   Go to the ER if any chest pain, shortness of breath, nausea, dizziness, severe HA, changes vision/speech  Please pick one of the over the counter allergy medications below and take it once daily for allergies.  Claritin or loratadine cheapest but likely the weakest  Zyrtec or certizine at night because it can make you sleepy The strongest is allegra or fexafinadine  Cheapest at [employer], [employer], [employer]  Can try nasonex  Can do a steroid nasal spary 1-2 sparys at night each nostril. Remember to spray each nostril twice towards the outer part of your eye.  Do not sniff but instead pinch your nose and tilt your head back to help the medicine get into your sinuses.  The best time to do this is at bedtime. Stop if you get blurred vision or nose bleeds.      Hammer Toe Hammer toe is a change in the shape (a deformity) of your second, third, or fourth toe. The deformity causes the middle joint of your toe to stay bent. This causes pain, especially when you are wearing shoes. Hammer toe starts gradually. At first, the toe can be straightened. Gradually over time, the deformity becomes stiff and permanent. Early treatments to keep the toe straight may relieve pain. As the deformity becomes stiff and permanent, surgery may be needed to straighten the toe. What are the causes? Hammer toe is caused by abnormal bending of the toe joint that is closest to your foot. It happens gradually over time. This pulls on the muscles and connections (tendons) of the toe  joint, making them weak and stiff. It is often related to wearing shoes that are too short or narrow and do not let your toes straighten. What increases the risk? You may be at greater risk for hammer toe if you:  Are female.  Are older.  Wear shoes that are too small.  Wear high-heeled shoes that pinch your toes.  Are a Advertising account planner.  Have a second toe that is longer than your big toe (first toe).  Injure your foot or toe.  Have arthritis.  Have a family history of hammer toe.  Have a nerve or muscle disorder.  What are the signs or symptoms? The main symptoms of this condition are pain and deformity of the toe. The pain is worse when wearing shoes, walking, or running. Other symptoms may include:  Corns or calluses over the bent part of the toe or between the toes.  Redness and a burning feeling on the toe.  An open sore that forms on the top of the toe.  Not being able to straighten the toe.  How is this diagnosed? This condition is diagnosed based on your symptoms and a physical exam. During the exam, your health care provider will try to straighten your toe to see how stiff the deformity is. You may also have tests, such as:  A blood test to check for rheumatoid arthritis.  An X-ray to show how severe the deformity is.  How is this treated? Treatment for this condition will depend on how stiff the deformity is. Surgery is often needed. However, sometimes a hammer toe can be straightened without surgery. Treatments that do not involve surgery include:  Taping the toe into a straightened position.  Using pads and cushions to protect the toe (orthotics).  Wearing shoes that provide enough room for the toes.  Doing toe-stretching exercises at home.  Taking an NSAID to reduce pain and swelling.  If these treatments do not help or the toe cannot be straightened, surgery is the next option. The most common surgeries used to straighten a hammer toe  include:  Arthroplasty. In this procedure, part of the joint is removed, and that allows the toe to straighten.  Fusion. In this procedure, cartilage between the two bones of the joint is taken out and the bones are fused together into one longer bone.  Implantation. In this procedure, part of the bone is removed and replaced with an implant to let the toe move again.  Flexor tendon transfer. In this procedure, the tendons that curl the toes down (flexor tendons) are repositioned.  Follow these instructions at home:  Take over-the-counter and prescription medicines only as told by your health care provider.  Do toe straightening and stretching exercises as told by your health care provider.  Keep all follow-up visits as told by your health care provider. This is important. How is this prevented?  Wear shoes that give your toes enough room and do not cause pain.  Do not wear high-heeled shoes. Contact a health care provider if:  Your pain gets worse.  Your toe becomes red or swollen.  You develop an open sore on your toe. This information is not intended to replace advice given to you by your health care provider. Make sure you discuss any questions you have with your health care provider. Document Released: 02/28/2000 Document Revised: 09/20/2015 Document Reviewed: 06/26/2015 Elsevier Interactive Patient Education  [employer]2018 [employer]

## 2017-04-21 NOTE — Progress Notes (Signed)
MEDICARE ANNUAL WELLNESS VISIT AND acute visit  Assessment:   Essential hypertension - continue medications, DASH diet, exercise and monitor at home. Call if greater than 130/80.  -     CBC with Differential/Platelet -     BASIC METABOLIC PANEL WITH GFR -     Hepatic function panel -     TSH  Mixed hyperlipidemia -continue medications, check lipids, decrease fatty foods, increase activity.  -     Lipid panel  Prediabetes Discussed general issues about diabetes pathophysiology and management., Educational material distributed., Suggested low cholesterol diet., Encouraged aerobic exercise., Discussed foot care., Reminded to get yearly retinal exam. -     Hemoglobin A1c  Vitamin D deficiency Continue supplement  Medication management -     Magnesium  Recurrent major depressive disorder, in partial remission (HCC) - continue medications, stress management techniques discussed, increase water, good sleep hygiene discussed, increase exercise, and increase veggies.   Encounter for Medicare annual wellness exam 1 year  Recurrent UTI monitor  Class 1 obesity due to excess calories with serious comorbidity and body mass index (BMI) of 31.0 to 31.9 in adult - long discussion about weight loss, diet, and exercise  Ocular herpes zoster Continue follow up eye doctor  URI Will hold the zpak and take if she is not getting better, increase fluids, rest, cont allergy pill breo samples given  Hammer toe right Get separator, if not better will refer to podiatry  Over 40 minutes of exam, counseling, chart review and critical decision making was performed Future Appointments  Date Time Provider Department Center  05/19/2017 11:00 AM Quentin Mullingollier, Tilman Mcclaren, PA-C [facility] None     Plan:   During the course of the visit the patient was educated and counseled about appropriate screening and preventive services including:    Pneumococcal vaccine   Prevnar 13  Influenza vaccine  Td  vaccine  Screening electrocardiogram  Bone densitometry screening  Colorectal cancer screening  Diabetes screening  Glaucoma screening  Nutrition counseling   Advanced directives: requested   Subjective:  Lewis ShockDonna W Ewen is a 72 y.o. female who presents for Medicare Annual Wellness Visit and acute visit.   Excused to flu, started to feel bad x Saturday, has wheezing, coughing still. Day 5.    Her blood pressure has been controlled at home, today their BP is BP: 128/76 She does not workout. She denies chest pain, shortness of breath, dizziness.  She is on cholesterol medication and denies myalgias. Her cholesterol is at goal. The cholesterol last visit was:   Lab Results  Component Value Date   CHOL 173 07/23/2016   HDL 45 (L) 07/23/2016   LDLCALC 72 07/23/2016   TRIG 278 (H) 07/23/2016   CHOLHDL 3.8 07/23/2016   Last U9WA1C normal range, but has been in the preDM range in the past.  Lab Results  Component Value Date   HGBA1C 5.9 (H) 07/23/2016   Last GFR: Lab Results  Component Value Date   GFRNONAA 62 07/23/2016   Patient is on Vitamin D supplement.   Lab Results  Component Value Date   VD25OH 48 02/05/2016     BMI is Body mass index is 31.21 kg/m., she is working on diet and exercise. Wt Readings from Last 3 Encounters:  04/21/17 179 lb (81.2 kg)  09/18/16 178 lb 9.6 oz (81 kg)  07/23/16 178 lb (80.7 kg)   She has ocular herpes, on acyclovir for this and follows Dr. Noel Geroldohen every 6 months.  She had  right big toe nail removed, she has bilateral hammer toes, has some blister/redness on 2nd medial toe.   Medication Review: Current Outpatient Medications on File Prior to Visit  Medication Sig Dispense Refill  . acyclovir (ZOVIRAX) 400 MG tablet Take 400 mg by mouth 3 (three) times daily.     Marland Kitchen ALPRAZolam (XANAX) 0.5 MG tablet Take 1 tablet (0.5 mg total) by mouth at bedtime as needed for anxiety. 90 tablet 1  . aspirin 81 MG tablet Take 81 mg by mouth every  other day.    . bisoprolol-hydrochlorothiazide (ZIAC) 5-6.25 MG tablet TAKE 1 TABLET BY MOUTH ONCE DAILY FOR BLOOD PRESSURE 90 tablet 1  . cetirizine (ZYRTEC) 10 MG tablet Take 10 mg by mouth daily.    . Cholecalciferol (VITAMIN D PO) Take 2,000 Units by mouth 3 (three) times daily.    . Coenzyme Q10 (COQ10 PO) Take 1 capsule by mouth daily.    Marland Kitchen escitalopram (LEXAPRO) 20 MG tablet TAKE 1 TABLET BY MOUTH DAILY FOR MOOD 90 tablet 0  . furosemide (LASIX) 40 MG tablet Take 1 tablet (40 mg total) by mouth daily. 90 tablet 0  . LORazepam (ATIVAN) 2 MG tablet TAKE 1/2 TO 1 TABLET BY MOUTH NIGHTLY ATBEDTIME 90 tablet 1  . Multiple Vitamins-Minerals (MULTIVITAMIN PO) Take by mouth 2 (two) times daily.    Marland Kitchen OVER THE COUNTER MEDICATION Calcium 2 tabs per day    . OVER THE COUNTER MEDICATION 1 capsule daily. Tumeric    . prednisoLONE acetate (PRED FORTE) 1 % ophthalmic suspension   1   No current facility-administered medications on file prior to visit.     Allergies  Allergen Reactions  . Ppd [Tuberculin Purified Protein Derivative]     Current Problems (verified) Patient Active Problem List   Diagnosis Date Noted  . Depression 01/28/2015  . Medication management 10/03/2014  . Encounter for Medicare annual wellness exam 10/03/2014  . Obesity 03/21/2014  . Recurrent UTI 03/21/2014  . Hypertension   . Hyperlipidemia   . Vitamin D deficiency   . Prediabetes     Screening Tests Immunization History  Administered Date(s) Administered  . DT 01/08/2015  . Hepatitis B 03/16/2001, 09/22/2001  . Influenza, High Dose Seasonal PF 11/13/2013, 01/08/2015, 12/25/2015, 01/13/2017  . Pneumococcal Conjugate-13 07/23/2016  . Pneumococcal Polysaccharide-23 12/25/2010  . Td 06/17/2004  . Zoster 07/08/2010   Preventative care: Tetanus: 01/08/15 Pneumovax:12/25/2010 [facility]ar 13: [facility] Flu vaccine: 2017 Zostavax:07/08/10  MGM: 02/2015, due DEXA: 2016 Colonoscopy: 10/06/13 CXR  2013  Names of Other Physician/Practitioners you currently use: 1. [facility] here for primary care 2. Cresenciano Genre, eye doctor, last visit 12/2015 has OV Patient Care Team: Lucky Cowboy, MD as PCP - General (Internal Medicine) Vida Rigger, MD as Consulting Physician (Gastroenterology) Elpidio Galea, MD as Consulting Physician (Ophthalmology) Sallye Lat, MD as Consulting Physician (Optometry) Arminda Resides, MD as Consulting Physician (Dermatology)  SURGICAL HISTORY She  has a past surgical history that includes Tonsillectomy and adenoidectomy and Eye surgery (Left, 11/2012). FAMILY HISTORY Her family history includes Diabetes in her father; Heart disease in her mother; Hyperlipidemia in her mother; Hypertension in her father. SOCIAL HISTORY She  reports that  has never smoked. she has never used smokeless tobacco.   MEDICARE WELLNESS OBJECTIVES: Physical activity: Current Exercise Habits: The patient does not participate in regular exercise at present Cardiac risk factors: Cardiac Risk Factors include: advanced age (>62men, >23 women);dyslipidemia;hypertension;sedentary lifestyle;obesity (BMI >30kg/m2);family history of premature cardiovascular  disease Depression/mood screen:   Depression screen [facility] 2/9 04/21/2017  Decreased Interest 0  Down, Depressed, Hopeless 0  PHQ - 2 Score 0  Altered sleeping -  Tired, decreased energy -  Change in appetite -  Feeling bad or failure about yourself  -  Trouble concentrating -  Moving slowly or fidgety/restless -  Suicidal thoughts -  PHQ-9 Score -  Difficult doing work/chores -    ADLs:  In your present state of health, do you have any difficulty performing the following activities: 04/21/2017 07/23/2016  Hearing? N N  Vision? N N  Difficulty concentrating or making decisions? N N  Walking or climbing stairs? N N  Dressing or bathing? N N  Doing errands, shopping? N N  Some recent data  might be hidden     Cognitive Testing  Alert? Yes  Normal Appearance?Yes  Oriented to person? Yes  Place? Yes   Time? Yes  Recall of three objects?  Yes  Can perform simple calculations? Yes  Displays appropriate judgment?Yes  Can read the correct time from a watch face?Yes  EOL planning: Does Patient Have a Medical Advance Directive?: Yes Type of Advance Directive: Healthcare Power of Attorney, Living will Does patient want to make changes to medical advance directive?: No - Patient declined  Review of Systems  Constitutional: Negative for chills, fever and malaise/fatigue.  HENT: Negative for congestion, ear pain and sore throat.   Eyes: Negative.   Respiratory: Negative for cough, shortness of breath and wheezing.   Cardiovascular: Negative for chest pain, palpitations and leg swelling.  Gastrointestinal: Negative for abdominal pain, blood in stool, constipation, diarrhea, heartburn and melena.  Genitourinary: Negative.   Skin: Negative.   Neurological: Negative for dizziness, sensory change, loss of consciousness and headaches.  Psychiatric/Behavioral: Negative for depression. The patient is not nervous/anxious and does not have insomnia.      Objective:     Today's Vitals   04/21/17 0929  BP: 128/76  Pulse: (!) 114  Temp: (!) 97.3 F (36.3 C)  SpO2: 97%  Weight: 179 lb (81.2 kg)  Height: 5' 3.5" (1.613 m)   Body mass index is 31.21 kg/m.  General appearance: alert, no distress, WD/WN, female HEENT: normocephalic, sclerae anicteric, TMs pearly, nares patent, no discharge or erythema, pharynx normal Oral cavity: MMM, no lesions Neck: supple, no lymphadenopathy, no thyromegaly, no masses Heart: RRR, normal S1, S2, no murmurs Lungs: CTA bilaterally, no wheezes, rhonchi, or rales Abdomen: +bs, soft, non tender, non distended, no masses, no hepatomegaly, no splenomegaly Musculoskeletal: nontender, no swelling, no obvious deformity Extremities: no edema, no  cyanosis, no clubbing, right great toenail thick, brittle, no warmth, erythema Pulses: 2+ symmetric, upper and lower extremities, normal cap refill Neurological: alert, oriented x 3, CN2-12 intact, strength normal upper extremities and lower extremities, sensation normal throughout, DTRs 2+ throughout, no cerebellar signs, gait normal Psychiatric: normal affect, behavior normal, pleasant   Medicare Attestation I have personally reviewed: The patient's medical and social history Their use of alcohol, tobacco or illicit drugs Their current medications and supplements The patient's functional ability including ADLs,fall risks, home safety risks, cognitive, and hearing and visual impairment Diet and physical activities Evidence for depression or mood disorders  The patient's weight, height, BMI, and visual acuity have been recorded in the chart.  I have made referrals, counseling, and provided education to the patient based on review of the above and I have provided the patient with a written personalized care plan for preventive services.  Quentin Mulling, PA-C   04/21/2017

## 2017-05-05 DIAGNOSIS — H04123 Dry eye syndrome of bilateral lacrimal glands: Secondary | ICD-10-CM | POA: Diagnosis not present

## 2017-05-05 DIAGNOSIS — H16302 Unspecified interstitial keratitis, left eye: Secondary | ICD-10-CM | POA: Diagnosis not present

## 2017-05-05 DIAGNOSIS — H1712 Central corneal opacity, left eye: Secondary | ICD-10-CM | POA: Diagnosis not present

## 2017-05-05 DIAGNOSIS — H2511 Age-related nuclear cataract, right eye: Secondary | ICD-10-CM | POA: Diagnosis not present

## 2017-05-05 DIAGNOSIS — Z961 Presence of intraocular lens: Secondary | ICD-10-CM | POA: Diagnosis not present

## 2017-05-05 DIAGNOSIS — H17822 Peripheral opacity of cornea, left eye: Secondary | ICD-10-CM | POA: Diagnosis not present

## 2017-05-11 ENCOUNTER — Other Ambulatory Visit: Payer: Self-pay | Admitting: Internal Medicine

## 2017-05-17 ENCOUNTER — Other Ambulatory Visit: Payer: Self-pay | Admitting: Internal Medicine

## 2017-05-17 NOTE — Progress Notes (Signed)
CPE and follow up  Assessment:   Essential hypertension - continue medications, DASH diet, exercise and monitor at home. Call if greater than 130/80.  -     CBC with Differential/Platelet -     BASIC METABOLIC PANEL WITH GFR -     Hepatic function panel -     TSH  Mixed hyperlipidemia -continue medications, check lipids, decrease fatty foods, increase activity.  -     Lipid panel  Prediabetes Discussed general issues about diabetes pathophysiology and management., Educational material distributed., Suggested low cholesterol diet., Encouraged aerobic exercise., Discussed foot care., Reminded to get yearly retinal exam. -     Hemoglobin A1c  Vitamin D deficiency Continue supplement  Medication management -     Magnesium  Recurrent major depressive disorder, in partial remission (HCC) - continue medications, stress management techniques discussed, increase water, good sleep hygiene discussed, increase exercise, and increase veggies.   Recurrent UTI monitor  Class 1 obesity due to excess calories with serious comorbidity and body mass index (BMI) of 31.0 to 31.9 in adult - long discussion about weight loss, diet, and exercise  Ocular herpes zoster Continue follow up eye doctor   Over 40 minutes of exam, counseling, chart review and critical decision making was performed Future Appointments  Date Time Provider Department Center  05/24/2018 10:00 AM Quentin Mulling, PA-C [facility] None    Subjective:  Allison Barker is a 72 y.o. female who presents for CPE and follow up.    Her blood pressure has been controlled at home, today their BP is BP: 124/78 She does not workout. She denies chest pain, shortness of breath, dizziness.  She is on cholesterol medication and denies myalgias. Her cholesterol is at goal. The cholesterol last visit was:   Lab Results  Component Value Date   CHOL 173 07/23/2016   HDL 45 (L) 07/23/2016   LDLCALC 72 07/23/2016   TRIG 278 (H) 07/23/2016    CHOLHDL 3.8 07/23/2016   Last Z6X normal range, but has been in the preDM range in the past.  Lab Results  Component Value Date   HGBA1C 5.9 (H) 07/23/2016   Last GFR: Lab Results  Component Value Date   GFRNONAA 62 07/23/2016   Patient is on Vitamin D supplement.   Lab Results  Component Value Date   VD25OH 48 02/05/2016     BMI is Body mass index is 32.79 kg/m., she is working on diet and exercise. Wt Readings from Last 3 Encounters:  05/19/17 186 lb 9.6 oz (84.6 kg)  04/21/17 179 lb (81.2 kg)  09/18/16 178 lb 9.6 oz (81 kg)   She has ocular herpes, on acyclovir for this and follows Dr. Noel Gerold every 6 months.  She had right big toe nail removed, she has bilateral hammer toes, has some blister/redness on 2nd medial toe.   Medication Review: Current Outpatient Medications on File Prior to Visit  Medication Sig  . ALPRAZolam (XANAX) 0.5 MG tablet Take 1 tablet (0.5 mg total) by mouth at bedtime as needed for anxiety.  Marland Kitchen aspirin 81 MG tablet Take 81 mg by mouth daily.   . bisoprolol-hydrochlorothiazide (ZIAC) 5-6.25 MG tablet TAKE 1 TABLET BY MOUTH ONCE DAILY FOR BLOOD PRESSURE  . cetirizine (ZYRTEC) 10 MG tablet Take 10 mg by mouth daily.  . Cholecalciferol (VITAMIN D PO) Take 2,000 Units by mouth 3 (three) times daily.  . Coenzyme Q10 (COQ10 PO) Take 1 capsule by mouth daily.  Marland Kitchen escitalopram (LEXAPRO) 20 MG tablet  TAKE 1 TABLET BY MOUTH DAILY FOR MOOD  . fluticasone furoate-vilanterol (BREO ELLIPTA) 100-25 MCG/INH AEPB Inhale 1 puff into the lungs daily. Rinse mouth with water after each use  . furosemide (LASIX) 40 MG tablet Take 1 tablet (40 mg total) by mouth daily.  . furosemide (LASIX) 40 MG tablet TAKE 1 TABLET BY MOUTH DAILY  . furosemide (LASIX) 40 MG tablet TAKE 1 TABLET BY MOUTH DAILY  . LORazepam (ATIVAN) 2 MG tablet TAKE 1/2 TO 1 TABLET BY MOUTH NIGHTLY ATBEDTIME  . Multiple Vitamins-Minerals (MULTIVITAMIN PO) Take by mouth 2 (two) times daily.  Marland Kitchen. OVER THE  COUNTER MEDICATION Calcium 2 tabs per day  . OVER THE COUNTER MEDICATION 1 capsule daily. Tumeric  . pravastatin (PRAVACHOL) 40 MG tablet Take 1 tablet (40 mg total) by mouth daily.  . prednisoLONE acetate (PRED FORTE) 1 % ophthalmic suspension   . valACYclovir (VALTREX) 500 MG tablet Take 500 mg by mouth 3 (three) times daily.  Marland Kitchen. acyclovir (ZOVIRAX) 400 MG tablet Take 400 mg by mouth 3 (three) times daily.    No current facility-administered medications on file prior to visit.      Allergies  Allergen Reactions  . Ppd [Tuberculin Purified Protein Derivative]     Current Problems (verified) Patient Active Problem List   Diagnosis Date Noted  . Depression 01/28/2015  . Medication management 10/03/2014  . Obesity 03/21/2014  . Recurrent UTI 03/21/2014  . Hypertension   . Hyperlipidemia   . Vitamin D deficiency   . Prediabetes     Screening Tests Immunization History  Administered Date(s) Administered  . DT 01/08/2015  . Hepatitis B 03/16/2001, 09/22/2001  . Influenza, High Dose Seasonal PF 11/13/2013, 01/08/2015, 12/25/2015, 01/13/2017  . Pneumococcal Conjugate-13 07/23/2016  . Pneumococcal Polysaccharide-23 12/25/2010  . Td 06/17/2004  . Zoster 07/08/2010   Preventative care: Tetanus: 01/08/15 Pneumovax:12/25/2010 Prevnar 13: 2014 at [facility] Flu vaccine: 2017 Zostavax:07/08/10  MGM: 10/31/20183D DEXA: 2016 Colonoscopy: 10/06/13 due 8 years CXR 2013  Names of Other Physician/Practitioners you currently use: 1. [facility] here for primary care 2. Cresenciano GenreSandy Cohen, eye doctor, last visit 12/2015 has OV Patient Care Team: Lucky CowboyMcKeown, William, MD as PCP - General (Internal Medicine) Vida RiggerMagod, Marc, MD as Consulting Physician (Gastroenterology) Elpidio Galeaohen, Sandya, MD as Consulting Physician (Ophthalmology) Sallye LatGroat, Christopher, MD as Consulting Physician (Optometry) Arminda ResidesJones, Daniel, MD as Consulting Physician (Dermatology)  SURGICAL HISTORY She   has a past surgical history that includes Tonsillectomy and adenoidectomy and Eye surgery (Left, 11/2012). FAMILY HISTORY Her family history includes Diabetes in her father; Heart disease in her mother; Hyperlipidemia in her mother; Hypertension in her father. SOCIAL HISTORY She  reports that  has never smoked. she has never used smokeless tobacco.   Review of Systems  Constitutional: Negative for chills, fever and malaise/fatigue.  HENT: Negative for congestion, ear pain and sore throat.   Eyes: Negative.   Respiratory: Negative for cough, shortness of breath and wheezing.   Cardiovascular: Negative for chest pain, palpitations and leg swelling.  Gastrointestinal: Negative for abdominal pain, blood in stool, constipation, diarrhea, heartburn and melena.  Genitourinary: Negative.   Skin: Negative.   Neurological: Negative for dizziness, sensory change, loss of consciousness and headaches.  Psychiatric/Behavioral: Negative for depression. The patient is not nervous/anxious and does not have insomnia.      Objective:     Today's Vitals   05/19/17 1043  BP: 124/78  Pulse: 74  Temp: (!) 97.3 F (36.3 C)  SpO2: 94%  Weight: 186 lb 9.6 oz (84.6 kg)  Height: 5' 3.25" (1.607 m)   Body mass index is 32.79 kg/m.  General appearance: alert, no distress, WD/WN, female HEENT: ecchymosis left eye lid from rolling out of bed normocephalic, sclerae anicteric, TMs pearly, nares patent, no discharge or erythema, pharynx normal Oral cavity: MMM, no lesions Neck: supple, no lymphadenopathy, no thyromegaly, no masses Heart: RRR, normal S1, S2, no murmurs Lungs: CTA bilaterally, no wheezes, rhonchi, or rales Abdomen: +bs, soft, non tender, non distended, no masses, no hepatomegaly, no splenomegaly Musculoskeletal: nontender, no swelling, no obvious deformity Extremities: no edema, no cyanosis, no clubbing, right great toenail thick, brittle, no warmth, erythema Pulses: 2+ symmetric, upper and  lower extremities, normal cap refill Neurological: alert, oriented x 3, CN2-12 intact, strength normal upper extremities and lower extremities, sensation normal throughout, DTRs 2+ throughout, no cerebellar signs, gait normal Psychiatric: normal affect, behavior normal, pleasant     Quentin Mulling, PA-C   05/19/2017

## 2017-05-19 ENCOUNTER — Encounter: Payer: Self-pay | Admitting: Physician Assistant

## 2017-05-19 ENCOUNTER — Ambulatory Visit (INDEPENDENT_AMBULATORY_CARE_PROVIDER_SITE_OTHER): Payer: PPO | Admitting: Physician Assistant

## 2017-05-19 VITALS — BP 124/78 | HR 74 | Temp 97.3°F | Ht 63.25 in | Wt 186.6 lb

## 2017-05-19 DIAGNOSIS — E559 Vitamin D deficiency, unspecified: Secondary | ICD-10-CM

## 2017-05-19 DIAGNOSIS — Z136 Encounter for screening for cardiovascular disorders: Secondary | ICD-10-CM | POA: Diagnosis not present

## 2017-05-19 DIAGNOSIS — N390 Urinary tract infection, site not specified: Secondary | ICD-10-CM

## 2017-05-19 DIAGNOSIS — E6609 Other obesity due to excess calories: Secondary | ICD-10-CM

## 2017-05-19 DIAGNOSIS — R7303 Prediabetes: Secondary | ICD-10-CM | POA: Diagnosis not present

## 2017-05-19 DIAGNOSIS — Z0000 Encounter for general adult medical examination without abnormal findings: Secondary | ICD-10-CM | POA: Diagnosis not present

## 2017-05-19 DIAGNOSIS — F411 Generalized anxiety disorder: Secondary | ICD-10-CM

## 2017-05-19 DIAGNOSIS — E782 Mixed hyperlipidemia: Secondary | ICD-10-CM

## 2017-05-19 DIAGNOSIS — F3341 Major depressive disorder, recurrent, in partial remission: Secondary | ICD-10-CM

## 2017-05-19 DIAGNOSIS — I10 Essential (primary) hypertension: Secondary | ICD-10-CM

## 2017-05-19 DIAGNOSIS — Z0001 Encounter for general adult medical examination with abnormal findings: Secondary | ICD-10-CM

## 2017-05-19 DIAGNOSIS — Z6831 Body mass index (BMI) 31.0-31.9, adult: Secondary | ICD-10-CM

## 2017-05-19 DIAGNOSIS — Z79899 Other long term (current) drug therapy: Secondary | ICD-10-CM

## 2017-05-19 MED ORDER — ALPRAZOLAM 0.5 MG PO TABS: 0.5000 mg | ORAL_TABLET | Freq: Every evening | ORAL | 0 refills | Status: DC | PRN

## 2017-05-19 NOTE — Patient Instructions (Signed)
When it comes to diets, agreement about the perfect plan isn't easy to find, even among the experts. Experts at the [facility] [employer] developed an idea known as the Healthy Eating Plate. Just imagine a plate divided into logical, healthy portions.  The emphasis is on diet quality:  Load up on vegetables and fruits - one-half of your plate: Aim for color and variety, and remember that potatoes don't count.  Go for whole grains - one-quarter of your plate: Whole wheat, barley, wheat berries, quinoa, oats, brown rice, and foods made with them. If you want pasta, go with whole wheat pasta.  Protein power - one-quarter of your plate: Fish, chicken, beans, and nuts are all healthy, versatile protein sources. Limit red meat.  The diet, however, does go beyond the plate, offering a few other suggestions.  Use healthy plant oils, such as olive, canola, soy, corn, sunflower and peanut. Check the labels, and avoid partially hydrogenated oil, which have unhealthy trans fats.  If you're thirsty, drink water. Coffee and tea are good in moderation, but skip sugary drinks and limit milk and dairy products to one or two daily servings.  The type of carbohydrate in the diet is more important than the amount. Some sources of carbohydrates, such as vegetables, fruits, whole grains, and beans-are healthier than others.  Finally, stay active.  Veggies are great because you can eat a ton! They are low in calories, great to fill you up, and have a ton of vitamins, minerals, and protein.      8 Critical Weight-Loss Tips That Aren't Diet and Exercise  1. STARVE THE DISTRACTIONS  All too often when we eat, we're also multitasking: watching TV, answering emails, scrolling through social media. These habits are detrimental to having a strong, clear, healthy relationship with food, and they can hinder our ability to make dietary changes.  In order to truly focus on what you're eating, how much  you're eating, why you're eating those specific foods and, most importantly, how those foods make you feel, you need to starve the distractions. That means when you eat, just eat. Focus on your food, the process it went through to end up on your plate, where it came from and how it nourishes you. With this technique, you're more likely to finish a meal feeling satiated.  2.  CONSIDER WHAT YOU'RE NOT WILLING TO DO  This might sound counterintuitive, but it can help provide a "why" when motivation is waning. Declare, in writing, what you are unwilling to do, for example "I am unwilling to be the old dad who cannot play sports with my children".  So consider what you're not willing to accept, write it down, and keep it at the ready.  3.  STOP LABELING FOOD "GOOD" AND "BAD"  You've probably heard someone say they ate something "bad." Maybe you've even said it yourself.  The trouble with 'bad' foods isn't that they'll send you to the grave after a bite or two. The trouble comes when we eat excessive portions of really calorie-dense foods meal after meal, day after day.  Instead of labeling foods as good or bad, think about which foods you can eat a lot of, and which ones you should just eat a little of. Then, plan ways to eat the foods you really like in portions that fit with your overall goals. A good example of this would be having a slice of pizza alongside a club salad with chicken breast,  avocado and a bit of dressing. This is vastly different than 3 slices of pizza, 4 breadsticks with cheese sauce and half of a liter of regular soda.  4.  BRUSH YOUR TEETH AFTER YOU EAT  Getting your mindset in order is important, but sometimes small habits can make a big difference. After eating, you still have the taste of food in their mouth, which often causes people to eat more even if they are full or engage in a nibble or two of dessert.  Brushing your teeth will remove the taste of food from your mouth,  and the clean, minty freshness will serve as a cue that mealtime is over.  5.  FOCUS ON CROWDING NOT CUTTING  The most common first step during 'dieting' is to cut. We cut our portion sizes down, we cut out 'bad' foods, we cut out entire food groups. This act of cutting puts us and our minds into scarcity mode.  When something is off-limits, even if you're able to avoid it for a while, you could end up bingeing on it later because you've gone so long without it. So, instead of cutting, focus on crowding. If you crowd your plate and fill it up with more foods like veggies and protein, it simply allows less room for the other stuff. In other words, shift your focus away from what you can't eat, and celebrate the foods that will help you reach your goals.  6.  TAKE TRACKING A STEP FURTHER  Track what you eat, when you ate it, how much you ate and how that food made you feel. Being completely honest with yourself and writing down every single thing that passes through your lips will help you start to notice that maybe you actually do snack, possibly take in more sugar than you thought, eat when you're bored rather than just hungry or maybe that you have a habit of snacking before bed while watching TV.  The difference from simply tracking your food intake is you're taking into account how food makes you feel, as well as what you're doing while you're eating. This is about becoming more mindful of what, when and why you eat.  7.  PRIORITIZE GOOD SLEEP  One of the strongest risk factors for being overweight is poor sleep. When you're feeling tired, you're more likely to choose unhealthy comfort foods and to skip your workout. Additionally, sleep deprivation may slow down your metabolism. Burnett KanarisYikes! Therefore, sleeping 7-8 hours per night can help with weight loss without having to change your diet or increase your physical activity. And if you feel you snore and still wake up tired, talk with me about sleep  apnea.  8.  SET ASIDE TIME TO DISCONNECT  Just get out there. Disconnect from the electronics and connect to the elements. Not only will this help reduce stress (a major factor in weight gain) by giving your mind a break from the constant stimulation we've all become so accustomed to, but it may also reprogram your brain to connect with yourself and what you're feeling.

## 2017-05-20 LAB — BASIC METABOLIC PANEL WITH GFR
BUN/Creatinine Ratio: 16 (calc) (ref 6–22)
BUN: 16 mg/dL (ref 7–25)
CALCIUM: 9.5 mg/dL (ref 8.6–10.4)
CHLORIDE: 102 mmol/L (ref 98–110)
CO2: 26 mmol/L (ref 20–32)
Creat: 1.01 mg/dL — ABNORMAL HIGH (ref 0.60–0.93)
GFR, EST AFRICAN AMERICAN: 65 mL/min/{1.73_m2} (ref >=60)
GFR, Est Non African American: 56 mL/min/{1.73_m2} — ABNORMAL LOW (ref >=60)
GLUCOSE: 122 mg/dL — AB (ref 65–99)
POTASSIUM: 4.3 mmol/L (ref 3.5–5.3)
Sodium: 139 mmol/L (ref 135–146)

## 2017-05-20 LAB — URINALYSIS, ROUTINE W REFLEX MICROSCOPIC
Bacteria, UA: NONE SEEN /HPF
Bilirubin Urine: NEGATIVE
Glucose, UA: NEGATIVE
HYALINE CAST: NONE SEEN /LPF
Hgb urine dipstick: NEGATIVE
Ketones, ur: NEGATIVE
Nitrite: NEGATIVE
PROTEIN: NEGATIVE
SQUAMOUS EPITHELIAL / LPF: NONE SEEN /HPF (ref <=5)
Specific Gravity, Urine: 1.012 (ref 1.001–1.03)
WBC, UA: NONE SEEN /HPF (ref 0–5)
pH: 5.5 (ref 5.0–8.0)

## 2017-05-20 LAB — HEMOGLOBIN A1C
HEMOGLOBIN A1C: 6.1 %{Hb} — AB (ref <5.7)
MEAN PLASMA GLUCOSE: 128 (calc)
eAG (mmol/L): 7.1 (calc)

## 2017-05-20 LAB — LIPID PANEL
CHOLESTEROL: 179 mg/dL (ref <200)
HDL: 45 mg/dL — AB (ref >50)
Non-HDL Cholesterol (Calc): 134 mg/dL (calc) — ABNORMAL HIGH (ref <130)
Total CHOL/HDL Ratio: 4 (calc) (ref <5.0)
Triglycerides: 433 mg/dL — ABNORMAL HIGH (ref <150)

## 2017-05-20 LAB — HEPATIC FUNCTION PANEL
AG Ratio: 1.2 (calc) (ref 1.0–2.5)
ALBUMIN MSPROF: 4 g/dL (ref 3.6–5.1)
ALT: 28 U/L (ref 6–29)
AST: 43 U/L — AB (ref 10–35)
Alkaline phosphatase (APISO): 77 U/L (ref 33–130)
BILIRUBIN DIRECT: 0.1 mg/dL (ref 0.0–0.2)
BILIRUBIN INDIRECT: 0.4 mg/dL (ref 0.2–1.2)
GLOBULIN: 3.3 g/dL (ref 1.9–3.7)
TOTAL PROTEIN: 7.3 g/dL (ref 6.1–8.1)
Total Bilirubin: 0.5 mg/dL (ref 0.2–1.2)

## 2017-05-20 LAB — CBC WITH DIFFERENTIAL/PLATELET
BASOS PCT: 0.7 %
Basophils Absolute: 58 cells/uL (ref 0–200)
Eosinophils Absolute: 183 cells/uL (ref 15–500)
Eosinophils Relative: 2.2 %
HCT: 41.8 % (ref 35.0–45.0)
Hemoglobin: 14.6 g/dL (ref 11.7–15.5)
Lymphs Abs: 2274 cells/uL (ref 850–3900)
MCH: 33.2 pg — ABNORMAL HIGH (ref 27.0–33.0)
MCHC: 34.9 g/dL (ref 32.0–36.0)
MCV: 95 fL (ref 80.0–100.0)
MONOS PCT: 9.7 %
MPV: 9.8 fL (ref 7.5–12.5)
NEUTROS PCT: 60 %
Neutro Abs: 4980 cells/uL (ref 1500–7800)
PLATELETS: 313 10*3/uL (ref 140–400)
RBC: 4.4 10*6/uL (ref 3.80–5.10)
RDW: 12.8 % (ref 11.0–15.0)
TOTAL LYMPHOCYTE: 27.4 %
WBC mixed population: 805 cells/uL (ref 200–950)
WBC: 8.3 10*3/uL (ref 3.8–10.8)

## 2017-05-20 LAB — MICROALBUMIN / CREATININE URINE RATIO
CREATININE, URINE: 47 mg/dL (ref 20–275)
MICROALB/CREAT RATIO: 6 ug/mg{creat} (ref <30)
Microalb, Ur: 0.3 mg/dL

## 2017-05-20 LAB — TSH: TSH: 3.1 mIU/L (ref 0.40–4.50)

## 2017-05-24 ENCOUNTER — Telehealth: Payer: Self-pay

## 2017-05-24 NOTE — Telephone Encounter (Signed)
-----   Message from Quentin MullingAmanda Collier, [place]PA-C sent at 05/24/2017  2:07 PM EDT ----- Regarding: RE: med check from pharmacy Please call patient and ask which one that she takes, she can only have one, not have both.  Marchelle FolksAmanda  ----- Message ----- From: Gregery Nauff, Shaynah Hund D, CMA Sent: 05/24/2017   1:45 PM To: Quentin MullingAmanda Collier, PA-C Subject: med check from pharmacy                        Pharmacy called wanting to know which of these meds should she be on. XANAX or ATIVAN.  Please advise.

## 2017-05-24 NOTE — Telephone Encounter (Signed)
Lm for pt to return call in order to ascertain which one she is taking....Marland Kitchen.ATIVAN or XANAX. March 11th 2019 at 2:59pm by DD

## 2017-05-25 ENCOUNTER — Other Ambulatory Visit: Payer: Self-pay | Admitting: Physician Assistant

## 2017-07-05 ENCOUNTER — Other Ambulatory Visit: Payer: Self-pay | Admitting: Internal Medicine

## 2017-07-26 ENCOUNTER — Encounter: Payer: Self-pay | Admitting: Physician Assistant

## 2017-07-26 ENCOUNTER — Ambulatory Visit (INDEPENDENT_AMBULATORY_CARE_PROVIDER_SITE_OTHER): Payer: PPO | Admitting: Physician Assistant

## 2017-07-26 VITALS — BP 126/84 | HR 94 | Temp 97.4°F | Resp 16 | Ht 63.25 in | Wt 184.0 lb

## 2017-07-26 DIAGNOSIS — N390 Urinary tract infection, site not specified: Secondary | ICD-10-CM

## 2017-07-26 DIAGNOSIS — J302 Other seasonal allergic rhinitis: Secondary | ICD-10-CM | POA: Diagnosis not present

## 2017-07-26 MED ORDER — SULFAMETHOXAZOLE-TRIMETHOPRIM 800-160 MG PO TABS: 1.0000 | ORAL_TABLET | Freq: Two times a day (BID) | ORAL | 0 refills | Status: DC

## 2017-07-26 NOTE — Progress Notes (Signed)
Subjective:    Patient ID: Allison Barker, female    DOB: [birth year], 72 y.o.   MRN: [id number]  HPI 72 y.o. WF with history of recurrent UTI presents with HA, pressure with voiding and urinary frequency, has had symptoms x last week. Has seen a urology in the past. Has not had a UTI in years. She has been on Azo that is helping with symptoms.  She has had some chills, some lower AB pain. She denies fever, flank pain, hematuria, lower abdominal pain and urinary incontinence.  She has had a headache every other day in the AM, with nasal pressure. On zyrtec.  She denies any associated neurological complications or symptoms, such as one-sided weakness, numbness, tingling, slurring of speech, droopy face, swallowing difficulties, diplopia, vision loss, hearing loss or tinnitus.    Blood pressure 126/84, pulse 94, temperature (!) 97.4 F (36.3 C), resp. rate 16, height 5' 3.25" (1.607 m), weight 184 lb (83.5 kg), SpO2 97 %.  Medications Current Outpatient Medications on File Prior to Visit  Medication Sig  . acyclovir (ZOVIRAX) 400 MG tablet Take 400 mg by mouth 3 (three) times daily.   Marland Kitchen ALPRAZolam (XANAX) 0.5 MG tablet Take 1 tablet (0.5 mg total) by mouth at bedtime as needed for anxiety.  Marland Kitchen aspirin 81 MG tablet Take 81 mg by mouth daily.   . bisoprolol-hydrochlorothiazide (ZIAC) 5-6.25 MG tablet TAKE 1 TABLET BY MOUTH ONCE DAILY FOR BLOOD PRESSURE  . cetirizine (ZYRTEC) 10 MG tablet Take 10 mg by mouth daily.  . Cholecalciferol (VITAMIN D PO) Take 2,000 Units by mouth 3 (three) times daily.  . Coenzyme Q10 (COQ10 PO) Take 1 capsule by mouth daily.  . fluticasone furoate-vilanterol (BREO ELLIPTA) 100-25 MCG/INH AEPB Inhale 1 puff into the lungs daily. Rinse mouth with water after each use  . furosemide (LASIX) 40 MG tablet TAKE 1 TABLET BY MOUTH DAILY  . Multiple Vitamins-Minerals (MULTIVITAMIN PO) Take by mouth 2 (two) times daily.  Marland Kitchen OVER THE COUNTER MEDICATION Calcium 2 tabs per day  .  OVER THE COUNTER MEDICATION 1 capsule daily. Tumeric  . pravastatin (PRAVACHOL) 40 MG tablet Take 1 tablet (40 mg total) by mouth daily.  . prednisoLONE acetate (PRED FORTE) 1 % ophthalmic suspension   . valACYclovir (VALTREX) 500 MG tablet Take 500 mg by mouth 3 (three) times daily.   No current facility-administered medications on file prior to visit.     Problem list She has Hypertension; Hyperlipidemia; Vitamin D deficiency; Prediabetes; Obesity; Recurrent UTI; Medication management; and Depression on their problem list.  Review of Systems  Constitutional: Negative for chills.  HENT: Negative.   Respiratory: Negative.   Cardiovascular: Negative.   Gastrointestinal: Negative.  Negative for nausea and vomiting.  Genitourinary: Positive for dysuria, frequency and urgency. Negative for decreased urine volume, difficulty urinating, dyspareunia, enuresis, flank pain, genital sores, hematuria, menstrual problem, pelvic pain, vaginal bleeding and vaginal discharge.  Neurological: Positive for headaches. Negative for dizziness, tremors, seizures, syncope, facial asymmetry, speech difficulty, weakness, light-headedness and numbness.       Objective:   Physical Exam  Constitutional: She is oriented to person, place, and time. She appears well-developed and well-nourished.  Neck: Normal range of motion. Neck supple.  Cardiovascular: Normal rate and regular rhythm.  Pulmonary/Chest: Effort normal and breath sounds normal.  Abdominal: Soft. Bowel sounds are normal. She exhibits no distension and no mass. There is tenderness. There is no rebound and no guarding.  Musculoskeletal: Normal range of motion. She  exhibits no tenderness.  Neurological: She is alert and oriented to person, place, and time.  Skin: Skin is warm and dry.       Assessment & Plan:    Recurrent UTI Check urine, will treat with bactrim versus vaginal atrophy -     Urinalysis, Routine w reflex microscopic -     Urine  Culture -     sulfamethoxazole-trimethoprim (BACTRIM DS,SEPTRA DS) 800-160 MG tablet; Take 1 tablet by mouth 2 (two) times daily.  Seasonal allergic rhinitis, unspecified trigger Get on allergy pill, nasal spray If not better call the office  The patient was advised to call immediately if she has any concerning symptoms in the interval. The patient voices understanding of current treatment options and is in agreement with the current care plan.The patient knows to call the clinic with any problems, questions or concerns or go to the ER if any further progression of symptoms.

## 2017-07-26 NOTE — Patient Instructions (Signed)
Being dehydrated can hurt your kidneys, cause fatigue, headaches, muscle aches, joint pain, and dry skin/nails so please increase your fluids.   Drink 80-100 oz a day of water, measure it out! Eat 3 meals a day, have to do breakfast, eat protein- hard boiled eggs, protein bar like nature valley protein bar, greek yogurt like oikos triple zero, chobani 100, or light n fit greek  Can check out plantnanny app on your phone to help you keep track of your water   Can do a steroid nasal spary 1-2 sparys at night each nostril. Remember to spray each nostril twice towards the outer part of your eye.  Do not sniff but instead pinch your nose and tilt your head back to help the medicine get into your sinuses.  The best time to do this is at bedtime. Stop if you get blurred vision or nose bleeds.     Urinary Tract Infection, Adult A urinary tract infection (UTI) is an infection of any part of the urinary tract, which includes the kidneys, ureters, bladder, and urethra. These organs make, store, and get rid of urine in the body. UTI can be a bladder infection (cystitis) or kidney infection (pyelonephritis). What are the causes? This infection may be caused by fungi, viruses, or bacteria. Bacteria are the most common cause of UTIs. This condition can also be caused by repeated incomplete emptying of the bladder during urination. What increases the risk? This condition is more likely to develop if:  You ignore your need to urinate or hold urine for long periods of time.  You do not empty your bladder completely during urination.  You wipe back to front after urinating or having a bowel movement, if you are female.  You are uncircumcised, if you are female.  You are constipated.  You have a urinary catheter that stays in place (indwelling).  You have a weak defense (immune) system.  You have a medical condition that affects your bowels, kidneys, or bladder.  You have diabetes.  You take  antibiotic medicines frequently or for long periods of time, and the antibiotics no longer work well against certain types of infections (antibiotic resistance).  You take medicines that irritate your urinary tract.  You are exposed to chemicals that irritate your urinary tract.  You are female.  What are the signs or symptoms? Symptoms of this condition include:  Fever.  Frequent urination or passing small amounts of urine frequently.  Needing to urinate urgently.  Pain or burning with urination.  Urine that smells bad or unusual.  Cloudy urine.  Pain in the lower abdomen or back.  Trouble urinating.  Blood in the urine.  Vomiting or being less hungry than normal.  Diarrhea or abdominal pain.  Vaginal discharge, if you are female.  How is this diagnosed? This condition is diagnosed with a medical history and physical exam. You will also need to provide a urine sample to test your urine. Other tests may be done, including:  Blood tests.  Sexually transmitted disease (STD) testing.  If you have had more than one UTI, a cystoscopy or imaging studies may be done to determine the cause of the infections. How is this treated? Treatment for this condition often includes a combination of two or more of the following:  Antibiotic medicine.  Other medicines to treat less common causes of UTI.  Over-the-counter medicines to treat pain.  Drinking enough water to stay hydrated.  Follow these instructions at home:  Take over-the-counter and  prescription medicines only as told by your health care provider.  If you were prescribed an antibiotic, take it as told by your health care provider. Do not stop taking the antibiotic even if you start to feel better.  Avoid alcohol, caffeine, tea, and carbonated beverages. They can irritate your bladder.  Drink enough fluid to keep your urine clear or pale yellow.  Keep all follow-up visits as told by your health care provider.  This is important.  Make sure to: ? Empty your bladder often and completely. Do not hold urine for long periods of time. ? Empty your bladder before and after sex. ? Wipe from front to back after a bowel movement if you are female. Use each tissue one time when you wipe. Contact a health care provider if:  You have back pain.  You have a fever.  You feel nauseous or vomit.  Your symptoms do not get better after 3 days.  Your symptoms go away and then return. Get help right away if:  You have severe back pain or lower abdominal pain.  You are vomiting and cannot keep down any medicines or water. This information is not intended to replace advice given to you by your health care provider. Make sure you discuss any questions you have with your health care provider. Document Released: 12/10/2004 Document Revised: 08/14/2015 Document Reviewed: 01/21/2015 Elsevier Interactive Patient Education  [employer].

## 2017-07-28 LAB — URINALYSIS, ROUTINE W REFLEX MICROSCOPIC
Bilirubin Urine: NEGATIVE
Glucose, UA: NEGATIVE
HGB URINE DIPSTICK: NEGATIVE
HYALINE CAST: NONE SEEN /LPF
Ketones, ur: NEGATIVE
Nitrite: NEGATIVE
PH: 5.5 (ref 5.0–8.0)
Protein, ur: NEGATIVE
RBC / HPF: NONE SEEN /HPF (ref 0–2)
Specific Gravity, Urine: 1.008 (ref 1.001–1.03)
Squamous Epithelial / LPF: NONE SEEN /HPF (ref <=5)

## 2017-07-28 LAB — URINE CULTURE
MICRO NUMBER:: 90585055
SPECIMEN QUALITY:: ADEQUATE

## 2017-08-04 DIAGNOSIS — H01022 Squamous blepharitis right lower eyelid: Secondary | ICD-10-CM | POA: Diagnosis not present

## 2017-08-04 DIAGNOSIS — H01025 Squamous blepharitis left lower eyelid: Secondary | ICD-10-CM | POA: Diagnosis not present

## 2017-08-04 DIAGNOSIS — H01024 Squamous blepharitis left upper eyelid: Secondary | ICD-10-CM | POA: Diagnosis not present

## 2017-08-04 DIAGNOSIS — H1712 Central corneal opacity, left eye: Secondary | ICD-10-CM | POA: Diagnosis not present

## 2017-08-04 DIAGNOSIS — H01021 Squamous blepharitis right upper eyelid: Secondary | ICD-10-CM | POA: Diagnosis not present

## 2017-08-04 DIAGNOSIS — H16302 Unspecified interstitial keratitis, left eye: Secondary | ICD-10-CM | POA: Diagnosis not present

## 2017-08-18 ENCOUNTER — Other Ambulatory Visit: Payer: Self-pay | Admitting: Internal Medicine

## 2017-08-27 ENCOUNTER — Ambulatory Visit (INDEPENDENT_AMBULATORY_CARE_PROVIDER_SITE_OTHER): Payer: PPO

## 2017-08-27 VITALS — Ht 63.25 in | Wt 184.4 lb

## 2017-08-27 DIAGNOSIS — N390 Urinary tract infection, site not specified: Secondary | ICD-10-CM | POA: Diagnosis not present

## 2017-08-27 LAB — URINALYSIS, ROUTINE W REFLEX MICROSCOPIC
BACTERIA UA: NONE SEEN /HPF
Bilirubin Urine: NEGATIVE
Glucose, UA: NEGATIVE
Hgb urine dipstick: NEGATIVE
Hyaline Cast: NONE SEEN /LPF
KETONES UR: NEGATIVE
Nitrite: NEGATIVE
PH: 6.5 (ref 5.0–8.0)
Protein, ur: NEGATIVE
RBC / HPF: NONE SEEN /HPF (ref 0–2)
Specific Gravity, Urine: 1.008 (ref 1.001–1.03)
WBC, UA: NONE SEEN /HPF (ref 0–5)

## 2017-08-27 NOTE — Addendum Note (Signed)
Addended by: Caryl AspARANT, Harbour Nordmeyer N on: 08/27/2017 11:46 AM   Modules accepted: Orders

## 2017-08-27 NOTE — Progress Notes (Signed)
PT REPORTS FOR FOLLOW NV FOR UTI. ABX FINISHED. SXS HAVE IMPROVED PER PT.

## 2017-08-29 LAB — URINE CULTURE
MICRO NUMBER: 90717765
SPECIMEN QUALITY:: ADEQUATE

## 2017-11-16 ENCOUNTER — Other Ambulatory Visit: Payer: Self-pay | Admitting: Physician Assistant

## 2017-11-16 DIAGNOSIS — F411 Generalized anxiety disorder: Secondary | ICD-10-CM

## 2017-11-23 NOTE — Progress Notes (Deleted)
FOLLOW UP  Assessment and Plan:    Continue diet and meds as discussed. Further disposition pending results of labs. Over 30 minutes of exam, counseling, chart review, and critical decision making was performed  Future Appointments  Date Time Provider Department Center  11/25/2017  2:30 PM Quentin Mulling, PA-C [facility] None  05/24/2018 10:00 AM Quentin Mulling, PA-C [facility] None     HPI 72 y.o. female  presents for 3 month follow up on hypertension, cholesterol, prediabetes, and vitamin D deficiency.   Her blood pressure {HAS HAS NOT:18834} been controlled at home, today their BP is     She {DOES_DOES GYK:59935} workout. She denies chest pain, shortness of breath, dizziness.  BMI is There is no height or weight on file to calculate BMI., she is working on diet and exercise. Wt Readings from Last 3 Encounters:  08/27/17 184 lb 6.4 oz (83.6 kg)  07/26/17 184 lb (83.5 kg)  05/19/17 186 lb 9.6 oz (84.6 kg)     She  {ACTION; IS/IS TSV:77939030}  on cholesterol medication and denies myalgias. Her cholesterol {ACTION; IS/IS NOT:21021397} at goal. The cholesterol last visit was:     She {Has/has not:18111} been working on diet and exercise for prediabetes, and denies {Symptoms; diabetes w/o none:19199}. Last A1C in the office was:  Lab Results  Component Value Date   HGBA1C 6.1 (H) 05/19/2017    Patient is on Vitamin D supplement.   Lab Results  Component Value Date   VD25OH 48 02/05/2016         Current Medications:  Current Outpatient Medications on File Prior to Visit  Medication Sig  . acyclovir (ZOVIRAX) 400 MG tablet Take 400 mg by mouth 3 (three) times daily.   Marland Kitchen ALPRAZolam (XANAX) 0.5 MG tablet TAKE 1 TABLET BY MOUTH AT BEDTIME AS NEEDED FOR ANXIETY  . aspirin 81 MG tablet Take 81 mg by mouth daily.   . bisoprolol-hydrochlorothiazide (ZIAC) 5-6.25 MG tablet TAKE 1 TABLET BY MOUTH ONCE DAILY FOR BLOOD PRESSURE  . cetirizine (ZYRTEC) 10 MG tablet Take 10 mg by  mouth daily.  . Cholecalciferol (VITAMIN D PO) Take 2,000 Units by mouth 3 (three) times daily.  . Coenzyme Q10 (COQ10 PO) Take 1 capsule by mouth daily.  Marland Kitchen escitalopram (LEXAPRO) 20 MG tablet TAKE 1 TABLET BY MOUTH DAILY FOR MOOD  . fluticasone furoate-vilanterol (BREO ELLIPTA) 100-25 MCG/INH AEPB Inhale 1 puff into the lungs daily. Rinse mouth with water after each use  . furosemide (LASIX) 40 MG tablet TAKE 1 TABLET BY MOUTH DAILY  . Multiple Vitamins-Minerals (MULTIVITAMIN PO) Take by mouth 2 (two) times daily.  Marland Kitchen OVER THE COUNTER MEDICATION Calcium 2 tabs per day  . OVER THE COUNTER MEDICATION 1 capsule daily. Tumeric  . pravastatin (PRAVACHOL) 40 MG tablet Take 1 tablet (40 mg total) by mouth daily.  . prednisoLONE acetate (PRED FORTE) 1 % ophthalmic suspension   . sulfamethoxazole-trimethoprim (BACTRIM DS,SEPTRA DS) 800-160 MG tablet Take 1 tablet by mouth 2 (two) times daily.  . valACYclovir (VALTREX) 500 MG tablet Take 500 mg by mouth 3 (three) times daily.   No current facility-administered medications on file prior to visit.     Medical History:  Past Medical History:  Diagnosis Date  . 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 Allergy   . Anxiety   . Arthritis   . Depression   . Hyperlipidemia   . Hypertension   . Osteopenia   . Prediabetes   .  Vitamin D deficiency    Allergies:  Allergies  Allergen Reactions  . Ppd [Tuberculin Purified Protein Derivative]      Review of Systems:  ROS  Family history- Review and unchanged Social history- Review and unchanged Physical Exam: There were no vitals taken for this visit. Wt Readings from Last 3 Encounters:  08/27/17 184 lb 6.4 oz (83.6 kg)  07/26/17 184 lb (83.5 kg)  05/19/17 186 lb 9.6 oz (84.6 kg)   General Appearance: Well nourished, in no apparent distress. Eyes: PERRLA, EOMs, conjunctiva no swelling or erythema Sinuses: No Frontal/maxillary tenderness ENT/Mouth: Ext aud canals clear, TMs without  erythema, bulging. No erythema, swelling, or exudate on post pharynx.  Tonsils not swollen or erythematous. Hearing normal.  Neck: Supple, thyroid normal.  Respiratory: Respiratory effort normal, BS equal bilaterally without rales, rhonchi, wheezing or stridor.  Cardio: RRR with no MRGs. Brisk peripheral pulses without edema.  Abdomen: Soft, + BS,  Non tender, no guarding, rebound, hernias, masses. Lymphatics: Non tender without lymphadenopathy.  Musculoskeletal: Full ROM, 5/5 strength, {PSY - GAIT AND STATION:22860} gait Skin: Warm, dry without rashes, lesions, ecchymosis.  Neuro: Cranial nerves intact. Normal muscle tone, no cerebellar symptoms. Psych: Awake and oriented X 3, normal affect, Insight and Judgment appropriate.    Quentin Mulling, PA-C 10:43 AM [facility]

## 2017-11-24 ENCOUNTER — Other Ambulatory Visit: Payer: Self-pay | Admitting: Internal Medicine

## 2017-11-25 ENCOUNTER — Ambulatory Visit: Payer: Self-pay | Admitting: Physician Assistant

## 2017-11-30 NOTE — Progress Notes (Signed)
FOLLOW UP  Assessment and Plan:   Essential hypertension -     CBC with Differential/Platelet -     COMPLETE METABOLIC PANEL WITH GFR -     TSH  Mixed hyperlipidemia -     Lipid panel  Medication management  Class 1 obesity due to excess calories with serious comorbidity and body mass index (BMI) of 31.0 to 31.9 in adult  Recurrent major depressive disorder, in partial remission (HCC)  Needs flu shot -     Flu vaccine HIGH DOSE PF  Prediabetes -     Hemoglobin A1c  Onychomycosis -     terbinafine (LAMISIL) 250 MG tablet; Take 1 tablet (250 mg total) by mouth daily. Take 1 pill daily for one month.     Continue diet and meds as discussed. Further disposition pending results of labs. Over 30 minutes of exam, counseling, chart review, and critical decision making was performed  Future Appointments  Date Time Provider Department Center  05/24/2018 10:00 AM Quentin Mulling, PA-C [facility] None     HPI 72 y.o. female  presents for 3 month follow up on hypertension, cholesterol, prediabetes, and vitamin D deficiency.   She had cough and runny nose x 3 days, she is took allergy pill and mucinex and is doing better. She has had her flu shot.   Her blood pressure has been controlled at home, today their BP is BP: 126/80   She does not workout. She denies chest pain, shortness of breath, dizziness.  BMI is Body mass index is 32.27 kg/m., she is working on diet and exercise. Wt Readings from Last 3 Encounters:  12/03/17 183 lb 9.6 oz (83.3 kg)  08/27/17 184 lb 6.4 oz (83.6 kg)  07/26/17 184 lb (83.5 kg)    She  is  on cholesterol medication and denies myalgias. Her cholesterol is not at goal. The cholesterol last visit was:   Lab Results  Component Value Date   CHOL 179 05/19/2017   HDL 45 (L) 05/19/2017   LDLCALC  05/19/2017     Comment:     . LDL cholesterol not calculated. Triglyceride levels greater than 400 mg/dL invalidate calculated LDL  results. . Reference range: <100 . Desirable range <100 mg/dL for primary prevention;   <70 mg/dL for patients with CHD or diabetic patients  with > or = 2 CHD risk factors. Marland Kitchen LDL-C is now calculated using the Martin-Hopkins  calculation, which is a validated novel method providing  better accuracy than the Friedewald equation in the  estimation of LDL-C.  Horald Pollen et al. Lenox Ahr. 0981;191(47): 2061-2068  (http://education.[employer].com/faq/FAQ164)    TRIG 433 (H) 05/19/2017   CHOLHDL 4.0 05/19/2017     She has been working on diet and exercise for prediabetes, and denies paresthesia of the feet, polydipsia, polyuria and visual disturbances. Last A1C in the office was:  Lab Results  Component Value Date   HGBA1C 6.1 (H) 05/19/2017   Patient is on Vitamin D supplement.   Lab Results  Component Value Date   VD25OH 48 02/05/2016     She had right big toe nail removed, she has bilateral hammer toes.    Current Medications:  Current Outpatient Medications on File Prior to Visit  Medication Sig  . acyclovir (ZOVIRAX) 400 MG tablet Take 400 mg by mouth 3 (three) times daily.   Marland Kitchen ALPRAZolam (XANAX) 0.5 MG tablet TAKE 1 TABLET BY MOUTH AT BEDTIME AS NEEDED FOR ANXIETY  . aspirin 81 MG tablet Take 81  mg by mouth daily.   . cetirizine (ZYRTEC) 10 MG tablet Take 10 mg by mouth daily.  . Cholecalciferol (VITAMIN D PO) Take 2,000 Units by mouth 3 (three) times daily.  . Coenzyme Q10 (COQ10 PO) Take 1 capsule by mouth daily.  Marland Kitchen. escitalopram (LEXAPRO) 20 MG tablet TAKE 1 TABLET BY MOUTH DAILY FOR MOOD  . furosemide (LASIX) 40 MG tablet TAKE 1 TABLET BY MOUTH DAILY  . Multiple Vitamins-Minerals (MULTIVITAMIN PO) Take by mouth 2 (two) times daily.  Marland Kitchen. OVER THE COUNTER MEDICATION Calcium 2 tabs per day  . OVER THE COUNTER MEDICATION 1 capsule daily. Tumeric  . pravastatin (PRAVACHOL) 40 MG tablet Take 1 tablet (40 mg total) by mouth daily.  . prednisoLONE acetate (PRED FORTE) 1 %  ophthalmic suspension   . valACYclovir (VALTREX) 500 MG tablet Take 500 mg by mouth 3 (three) times daily.   No current facility-administered medications on file prior to visit.     Medical History:  Past Medical History:  Diagnosis Date  . 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 Allergy   . Anxiety   . Arthritis   . Depression   . Hyperlipidemia   . Hypertension   . Osteopenia   . Prediabetes   . Vitamin D deficiency    Allergies:  Allergies  Allergen Reactions  . Ppd [Tuberculin Purified Protein Derivative]      Review of Systems:  Review of Systems  Constitutional: Negative.   HENT: Negative.   Eyes: Negative.   Respiratory: Negative.   Cardiovascular: Negative.   Gastrointestinal: Negative.   Genitourinary: Negative.   Musculoskeletal: Negative.   Skin: Negative.   Neurological: Negative.   Endo/Heme/Allergies: Negative.   Psychiatric/Behavioral: Negative.     Family history- Review and unchanged Social history- Review and unchanged Physical Exam: BP 126/80   Pulse 70   Temp (!) 97.5 F (36.4 C)   Resp 12   Ht 5' 3.25" (1.607 m)   Wt 183 lb 9.6 oz (83.3 kg)   SpO2 94%   BMI 32.27 kg/m  Wt Readings from Last 3 Encounters:  12/03/17 183 lb 9.6 oz (83.3 kg)  08/27/17 184 lb 6.4 oz (83.6 kg)  07/26/17 184 lb (83.5 kg)   General Appearance: Well nourished, in no apparent distress. Eyes: PERRLA, EOMs, conjunctiva no swelling or erythema Sinuses: No Frontal/maxillary tenderness ENT/Mouth: Ext aud canals clear, TMs without erythema, bulging. No erythema, swelling, or exudate on post pharynx.  Tonsils not swollen or erythematous. Hearing normal.  Neck: Supple, thyroid normal.  Respiratory: Respiratory effort normal, BS equal bilaterally without rales, rhonchi, wheezing or stridor.  Cardio: RRR with no MRGs. Brisk peripheral pulses without edema.  Abdomen: Soft, + BS,  Non tender, no guarding, rebound, hernias, masses. Lymphatics: Non tender  without lymphadenopathy.  Musculoskeletal: Full ROM, 5/5 strength, Normal gait Skin: Warm, dry without rashes, lesions, ecchymosis.  Neuro: Cranial nerves intact. Normal muscle tone, no cerebellar symptoms. Psych: Awake and oriented X 3, normal affect, Insight and Judgment appropriate.    Quentin MullingAmanda Renton Berkley, PA-C 11:51 AM [facility]

## 2017-12-03 ENCOUNTER — Ambulatory Visit (INDEPENDENT_AMBULATORY_CARE_PROVIDER_SITE_OTHER): Payer: PPO | Admitting: Physician Assistant

## 2017-12-03 ENCOUNTER — Encounter: Payer: Self-pay | Admitting: Physician Assistant

## 2017-12-03 ENCOUNTER — Other Ambulatory Visit: Payer: Self-pay

## 2017-12-03 VITALS — BP 126/80 | HR 70 | Temp 97.5°F | Resp 12 | Ht 63.25 in | Wt 183.6 lb

## 2017-12-03 DIAGNOSIS — B351 Tinea unguium: Secondary | ICD-10-CM | POA: Diagnosis not present

## 2017-12-03 DIAGNOSIS — Z6831 Body mass index (BMI) 31.0-31.9, adult: Secondary | ICD-10-CM | POA: Diagnosis not present

## 2017-12-03 DIAGNOSIS — E6609 Other obesity due to excess calories: Secondary | ICD-10-CM | POA: Diagnosis not present

## 2017-12-03 DIAGNOSIS — Z23 Encounter for immunization: Secondary | ICD-10-CM

## 2017-12-03 DIAGNOSIS — F3341 Major depressive disorder, recurrent, in partial remission: Secondary | ICD-10-CM | POA: Diagnosis not present

## 2017-12-03 DIAGNOSIS — R7303 Prediabetes: Secondary | ICD-10-CM

## 2017-12-03 DIAGNOSIS — Z79899 Other long term (current) drug therapy: Secondary | ICD-10-CM | POA: Diagnosis not present

## 2017-12-03 DIAGNOSIS — I10 Essential (primary) hypertension: Secondary | ICD-10-CM

## 2017-12-03 DIAGNOSIS — E782 Mixed hyperlipidemia: Secondary | ICD-10-CM

## 2017-12-03 MED ORDER — TERBINAFINE HCL 250 MG PO TABS: 250.0000 mg | ORAL_TABLET | Freq: Every day | ORAL | 0 refills | Status: DC

## 2017-12-03 MED ORDER — BISOPROLOL-HYDROCHLOROTHIAZIDE 5-6.25 MG PO TABS: ORAL_TABLET | ORAL | 1 refills | Status: DC

## 2017-12-03 NOTE — Patient Instructions (Addendum)
Okay I will send in lamisil for you to take. You can only get it from [facility] or [facility] becauser insurance will not cover it.The lamisil is taken once a day for  months and then if can be taken for several months after for a month on it and month off it. It gets processed through you liver so we need to check your liver function at 6 weeks after taking the drug. It can take up to 6 months to a year for your toenails to get better.      When it comes to diets, agreement about the perfect plan isn't easy to find, even among the experts. Experts at the [employer] developed an idea known as the Healthy Eating Plate. Just imagine a plate divided into logical, healthy portions.  The emphasis is on diet quality:  Load up on vegetables and fruits - one-half of your plate: Aim for color and variety, and remember that potatoes don't count.  Go for whole grains - one-quarter of your plate: Whole wheat, barley, wheat berries, quinoa, oats, brown rice, and foods made with them. If you want pasta, go with whole wheat pasta.  Protein power - one-quarter of your plate: Fish, chicken, beans, and nuts are all healthy, versatile protein sources. Limit red meat.  The diet, however, does go beyond the plate, offering a few other suggestions.  Use healthy plant oils, such as olive, canola, soy, corn, sunflower and peanut. Check the labels, and avoid partially hydrogenated oil, which have unhealthy trans fats.  If you're thirsty, drink water. Coffee and tea are good in moderation, but skip sugary drinks and limit milk and dairy products to one or two daily servings.  The type of carbohydrate in the diet is more important than the amount. Some sources of carbohydrates, such as vegetables, fruits, whole grains, and beans-are healthier than others.  Finally, stay active.

## 2017-12-04 LAB — COMPLETE METABOLIC PANEL WITH GFR
AG RATIO: 1.3 (calc) (ref 1.0–2.5)
ALT: 20 U/L (ref 6–29)
AST: 30 U/L (ref 10–35)
Albumin: 4.2 g/dL (ref 3.6–5.1)
Alkaline phosphatase (APISO): 76 U/L (ref 33–130)
BUN: 14 mg/dL (ref 7–25)
CO2: 29 mmol/L (ref 20–32)
Calcium: 9.6 mg/dL (ref 8.6–10.4)
Chloride: 98 mmol/L (ref 98–110)
Creat: 0.81 mg/dL (ref 0.60–0.93)
GFR, EST AFRICAN AMERICAN: 84 mL/min/{1.73_m2} (ref >=60)
GFR, EST NON AFRICAN AMERICAN: 73 mL/min/{1.73_m2} (ref >=60)
GLUCOSE: 116 mg/dL — AB (ref 65–99)
Globulin: 3.3 g/dL (calc) (ref 1.9–3.7)
POTASSIUM: 3.6 mmol/L (ref 3.5–5.3)
SODIUM: 138 mmol/L (ref 135–146)
Total Bilirubin: 0.7 mg/dL (ref 0.2–1.2)
Total Protein: 7.5 g/dL (ref 6.1–8.1)

## 2017-12-04 LAB — CBC WITH DIFFERENTIAL/PLATELET
BASOS ABS: 57 {cells}/uL (ref 0–200)
Basophils Relative: 0.8 %
EOS PCT: 3.5 %
Eosinophils Absolute: 249 cells/uL (ref 15–500)
HCT: 47.3 % — ABNORMAL HIGH (ref 35.0–45.0)
Hemoglobin: 16 g/dL — ABNORMAL HIGH (ref 11.7–15.5)
Lymphs Abs: 2109 cells/uL (ref 850–3900)
MCH: 32.5 pg (ref 27.0–33.0)
MCHC: 33.8 g/dL (ref 32.0–36.0)
MCV: 96.1 fL (ref 80.0–100.0)
MONOS PCT: 10.3 %
MPV: 9.8 fL (ref 7.5–12.5)
NEUTROS ABS: 3955 {cells}/uL (ref 1500–7800)
Neutrophils Relative %: 55.7 %
Platelets: 317 10*3/uL (ref 140–400)
RBC: 4.92 10*6/uL (ref 3.80–5.10)
RDW: 11.8 % (ref 11.0–15.0)
Total Lymphocyte: 29.7 %
WBC mixed population: 731 cells/uL (ref 200–950)
WBC: 7.1 10*3/uL (ref 3.8–10.8)

## 2017-12-04 LAB — LIPID PANEL
CHOL/HDL RATIO: 3.9 (calc) (ref <5.0)
Cholesterol: 165 mg/dL (ref <200)
HDL: 42 mg/dL — ABNORMAL LOW (ref >50)
LDL CHOLESTEROL (CALC): 77 mg/dL
NON-HDL CHOLESTEROL (CALC): 123 mg/dL (ref <130)
Triglycerides: 384 mg/dL — ABNORMAL HIGH (ref <150)

## 2017-12-04 LAB — HEMOGLOBIN A1C
EAG (MMOL/L): 7.3 (calc)
Hgb A1c MFr Bld: 6.2 % of total Hgb — ABNORMAL HIGH (ref <5.7)
Mean Plasma Glucose: 131 (calc)

## 2017-12-04 LAB — TSH: TSH: 2.26 mIU/L (ref 0.40–4.50)

## 2017-12-15 ENCOUNTER — Ambulatory Visit (INDEPENDENT_AMBULATORY_CARE_PROVIDER_SITE_OTHER): Payer: PPO

## 2017-12-15 DIAGNOSIS — R8271 Bacteriuria: Secondary | ICD-10-CM | POA: Diagnosis not present

## 2017-12-15 NOTE — Addendum Note (Signed)
Addended by: Emerson Monte on: 12/15/2017 04:04 PM   Modules accepted: Orders

## 2017-12-15 NOTE — Addendum Note (Signed)
Addended by: Dionicio Stall on: 12/15/2017 04:20 PM   Modules accepted: Level of Service

## 2017-12-15 NOTE — Progress Notes (Signed)
Patient presents to the office for a nurse visit to leave a urine sample to test for a UTI. Patient is complaining of dysuria, lower back pain and pelvic pressure. Is currently taking AZO for the pain. Urinalysis and Culture ordered and sent to the lab

## 2017-12-16 MED ORDER — SULFAMETHOXAZOLE-TRIMETHOPRIM 800-160 MG PO TABS: 1.0000 | ORAL_TABLET | Freq: Two times a day (BID) | ORAL | 0 refills | Status: DC

## 2017-12-16 NOTE — Addendum Note (Signed)
Addended by: Quentin Mulling R on: 12/16/2017 08:47 AM   Modules accepted: Orders

## 2017-12-17 LAB — URINE CULTURE
MICRO NUMBER: 91185282
SPECIMEN QUALITY: ADEQUATE

## 2017-12-17 LAB — URINALYSIS, ROUTINE W REFLEX MICROSCOPIC
Bilirubin Urine: NEGATIVE
Glucose, UA: NEGATIVE
HYALINE CAST: NONE SEEN /LPF
Hgb urine dipstick: NEGATIVE
KETONES UR: NEGATIVE
Nitrite: POSITIVE — AB
Protein, ur: NEGATIVE
RBC / HPF: NONE SEEN /HPF (ref 0–2)
SPECIFIC GRAVITY, URINE: 1.01 (ref 1.001–1.03)
Squamous Epithelial / LPF: NONE SEEN /HPF (ref <=5)
pH: 5.5 (ref 5.0–8.0)

## 2018-01-19 ENCOUNTER — Ambulatory Visit (INDEPENDENT_AMBULATORY_CARE_PROVIDER_SITE_OTHER): Payer: PPO

## 2018-01-19 DIAGNOSIS — Z79899 Other long term (current) drug therapy: Secondary | ICD-10-CM | POA: Diagnosis not present

## 2018-01-19 NOTE — Progress Notes (Signed)
Patient presents to the office for a nurse visit to have labs done to check liver function and leave a urine specimen to have a urinalysis and culture done. Patient had completed antibiotics for her UTI and is currently not having any signs or symptoms.

## 2018-01-20 ENCOUNTER — Ambulatory Visit: Payer: Self-pay

## 2018-01-21 LAB — HEPATIC FUNCTION PANEL
AG Ratio: 1.4 (calc) (ref 1.0–2.5)
ALBUMIN MSPROF: 4 g/dL (ref 3.6–5.1)
ALT: 17 U/L (ref 6–29)
AST: 27 U/L (ref 10–35)
Alkaline phosphatase (APISO): 67 U/L (ref 33–130)
BILIRUBIN DIRECT: 0.1 mg/dL (ref 0.0–0.2)
GLOBULIN: 2.9 g/dL (ref 1.9–3.7)
Indirect Bilirubin: 0.5 mg/dL (calc) (ref 0.2–1.2)
TOTAL PROTEIN: 6.9 g/dL (ref 6.1–8.1)
Total Bilirubin: 0.6 mg/dL (ref 0.2–1.2)

## 2018-01-21 LAB — URINALYSIS, ROUTINE W REFLEX MICROSCOPIC
Bilirubin Urine: NEGATIVE
Glucose, UA: NEGATIVE
Hgb urine dipstick: NEGATIVE
KETONES UR: NEGATIVE
Leukocytes, UA: NEGATIVE
NITRITE: NEGATIVE
PROTEIN: NEGATIVE
Specific Gravity, Urine: 1.007 (ref 1.001–1.03)
pH: 6.5 (ref 5.0–8.0)

## 2018-01-21 LAB — URINE CULTURE
MICRO NUMBER:: 91339510
SPECIMEN QUALITY: ADEQUATE

## 2018-02-02 DIAGNOSIS — Z961 Presence of intraocular lens: Secondary | ICD-10-CM | POA: Diagnosis not present

## 2018-02-02 DIAGNOSIS — H04123 Dry eye syndrome of bilateral lacrimal glands: Secondary | ICD-10-CM | POA: Diagnosis not present

## 2018-02-02 DIAGNOSIS — H01022 Squamous blepharitis right lower eyelid: Secondary | ICD-10-CM | POA: Diagnosis not present

## 2018-02-02 DIAGNOSIS — H16302 Unspecified interstitial keratitis, left eye: Secondary | ICD-10-CM | POA: Diagnosis not present

## 2018-02-02 DIAGNOSIS — H01021 Squamous blepharitis right upper eyelid: Secondary | ICD-10-CM | POA: Diagnosis not present

## 2018-02-02 DIAGNOSIS — H17822 Peripheral opacity of cornea, left eye: Secondary | ICD-10-CM | POA: Diagnosis not present

## 2018-02-02 DIAGNOSIS — H2511 Age-related nuclear cataract, right eye: Secondary | ICD-10-CM | POA: Diagnosis not present

## 2018-02-02 DIAGNOSIS — H43811 Vitreous degeneration, right eye: Secondary | ICD-10-CM | POA: Diagnosis not present

## 2018-02-02 DIAGNOSIS — H01025 Squamous blepharitis left lower eyelid: Secondary | ICD-10-CM | POA: Diagnosis not present

## 2018-02-02 DIAGNOSIS — H01024 Squamous blepharitis left upper eyelid: Secondary | ICD-10-CM | POA: Diagnosis not present

## 2018-02-09 ENCOUNTER — Telehealth: Payer: Self-pay | Admitting: Internal Medicine

## 2018-02-09 ENCOUNTER — Encounter: Payer: Self-pay | Admitting: Adult Health

## 2018-02-09 ENCOUNTER — Ambulatory Visit (INDEPENDENT_AMBULATORY_CARE_PROVIDER_SITE_OTHER): Payer: PPO | Admitting: Adult Health

## 2018-02-09 VITALS — BP 124/68 | HR 72 | Temp 97.1°F | Ht 63.25 in | Wt 184.6 lb

## 2018-02-09 DIAGNOSIS — N390 Urinary tract infection, site not specified: Secondary | ICD-10-CM

## 2018-02-09 DIAGNOSIS — R399 Unspecified symptoms and signs involving the genitourinary system: Secondary | ICD-10-CM

## 2018-02-09 MED ORDER — CEPHALEXIN 500 MG PO CAPS: ORAL_CAPSULE | ORAL | 0 refills | Status: DC

## 2018-02-09 NOTE — Progress Notes (Signed)
Assessment and Plan:  Allison Barker was seen today for acute visit and other.  Diagnoses and all orders for this visit:  UTI symptoms/ hx of Recurrent UTI Medications: keflex 500 mg BID x 7 days. Maintain adequate hydration. Follow up if symptoms not improving, and as needed. Discussed possible vaginal extrogen if continues to have recurrent issues, urology workup has been neg in past Recheck in 1 month if positive -     Urinalysis w microscopic + reflex cultur -     cephALEXin (KEFLEX) 500 MG capsule; Take 1 capsule 2 x/day after meals  Further disposition pending results of labs. Discussed med's effects and SE's.   Over 15 minutes of exam, counseling, chart review, and critical decision making was performed.   Future Appointments  Date Time Provider Department Center  05/24/2018 10:00 AM Quentin Mulling, PA-C [facility] None    ------------------------------------------------------------------------------------------------------------------   HPI BP 124/68   Pulse 72   Temp (!) 97.1 F (36.2 C)   Ht 5' 3.25" (1.607 m)   Wt 184 lb 9.6 oz (83.7 kg)   SpO2 96%   BMI 32.44 kg/m   72 y.o.female with hx of recurrent UTI presents for evaluation of dysuria, urgency and lower back pain x 2 days. She did take azo x 2 tabs last night but hasn't taken today. She denies abdominal pain or pressure, nausea, diarrhea, fever/chills, vaginal discharge, new sexual partners, hematuria.   She has been pushing hydration.  Most recently tx for UTI on 12/16/2017. Has seen urology in the past.   Past Medical History:  Diagnosis Date  . 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 Allergy   . Anxiety   . Arthritis   . Depression   . Hyperlipidemia   . Hypertension   . Osteopenia   . Prediabetes   . Vitamin D deficiency      Allergies  Allergen Reactions  . Ppd [Tuberculin Purified Protein Derivative]     Current Outpatient Medications on File Prior to Visit  Medication Sig  .  acyclovir (ZOVIRAX) 400 MG tablet Take 400 mg by mouth 3 (three) times daily.   Marland Kitchen ALPRAZolam (XANAX) 0.5 MG tablet TAKE 1 TABLET BY MOUTH AT BEDTIME AS NEEDED FOR ANXIETY  . aspirin 81 MG tablet Take 81 mg by mouth daily.   . bisoprolol-hydrochlorothiazide (ZIAC) 5-6.25 MG tablet TAKE 1 TABLET BY MOUTH ONCE DAILY FOR BLOOD PRESSURE  . cetirizine (ZYRTEC) 10 MG tablet Take 10 mg by mouth daily.  . Cholecalciferol (VITAMIN D PO) Take 2,000 Units by mouth 3 (three) times daily.  . Coenzyme Q10 (COQ10 PO) Take 1 capsule by mouth daily.  Marland Kitchen escitalopram (LEXAPRO) 20 MG tablet TAKE 1 TABLET BY MOUTH DAILY FOR MOOD  . furosemide (LASIX) 40 MG tablet TAKE 1 TABLET BY MOUTH DAILY  . Multiple Vitamins-Minerals (MULTIVITAMIN PO) Take by mouth 2 (two) times daily.  Marland Kitchen OVER THE COUNTER MEDICATION Calcium 2 tabs per day  . OVER THE COUNTER MEDICATION 1 capsule daily. Tumeric  . pravastatin (PRAVACHOL) 40 MG tablet Take 1 tablet (40 mg total) by mouth daily.  . prednisoLONE acetate (PRED FORTE) 1 % ophthalmic suspension   . terbinafine (LAMISIL) 250 MG tablet Take 1 tablet (250 mg total) by mouth daily. Take 1 pill daily for one month.  . valACYclovir (VALTREX) 500 MG tablet Take 500 mg by mouth 3 (three) times daily.   No current facility-administered medications on file prior to visit.  ROS: all negative except above.   Physical Exam:  BP 124/68   Pulse 72   Temp (!) 97.1 F (36.2 C)   Ht 5' 3.25" (1.607 m)   Wt 184 lb 9.6 oz (83.7 kg)   SpO2 96%   BMI 32.44 kg/m   General Appearance: Well nourished, in no apparent distress. Eyes: conjunctiva no swelling or erythema ENT/Mouth: Hearing normal.  Neck: Supple Respiratory: Respiratory effort normal, BS equal bilaterally without rales, rhonchi, wheezing or stridor.  Cardio: RRR with no MRGs. Brisk peripheral pulses without edema.  Abdomen: Soft, + BS.  Non tender, some suprapubic pressure with palpation, non-distended, no guarding, rebound,  hernias, masses. No CVA tenderness. Lymphatics: Non tender without lymphadenopathy.  Musculoskeletal: normal gait.  Skin: Warm, dry without rashes, lesions, ecchymosis.  Psych: Awake and oriented X 3, normal affect, Insight and Judgment appropriate.     Dan MakerAshley C Dylan Monforte, NP 3:28 PM [facility]

## 2018-02-09 NOTE — Telephone Encounter (Signed)
Schedule for office visit.

## 2018-02-09 NOTE — Telephone Encounter (Signed)
Patient called to advise yesterday started having recurrence of UTI, c/o pressure to urinate,frequency,  burning, ordor, bright yellow urine. Took AZO last night. pharmacy [facility]. Please advise your recommendation.

## 2018-02-09 NOTE — Patient Instructions (Signed)
Urinary Tract Infection, Adult A urinary tract infection (UTI) is an infection of any part of the urinary tract. The urinary tract includes the:  Kidneys.  Ureters.  Bladder.  Urethra.  These organs make, store, and get rid of pee (urine) in the body. Follow these instructions at home:  Take over-the-counter and prescription medicines only as told by your doctor.  If you were prescribed an antibiotic medicine, take it as told by your doctor. Do not stop taking the antibiotic even if you start to feel better.  Avoid the following drinks: ? Alcohol. ? Caffeine. ? Tea. ? Carbonated drinks.  Drink enough fluid to keep your pee clear or pale yellow.  Keep all follow-up visits as told by your doctor. This is important.  Make sure to: ? Empty your bladder often and completely. Do not to hold pee for long periods of time. ? Empty your bladder before and after sex. ? Wipe from front to back after a bowel movement if you are female. Use each tissue one time when you wipe. Contact a doctor if:  You have back pain.  You have a fever.  You feel sick to your stomach (nauseous).  You throw up (vomit).  Your symptoms do not get better after 3 days.  Your symptoms go away and then come back. Get help right away if:  You have very bad back pain.  You have very bad lower belly (abdominal) pain.  You are throwing up and cannot keep down any medicines or water. This information is not intended to replace advice given to you by your health care provider. Make sure you discuss any questions you have with your health care provider. Document Released: 08/19/2007 Document Revised: 08/08/2015 Document Reviewed: 01/21/2015 Elsevier Interactive Patient Education  2018 [employer].    Cephalexin tablets or capsules What is this medicine? CEPHALEXIN (sef a LEX in) is a cephalosporin antibiotic. It is used to treat certain kinds of bacterial infections It will not work for colds, flu,  or other viral infections. This medicine may be used for other purposes; ask your health care provider or pharmacist if you have questions. COMMON BRAND NAME(S): Biocef, Daxbia, Keflex, Keftab What should I tell my health care provider before I take this medicine? They need to know if you have any of these conditions: -kidney disease -stomach or intestine problems, especially colitis -an unusual or allergic reaction to cephalexin, other cephalosporins, penicillins, other antibiotics, medicines, foods, dyes or preservatives -pregnant or trying to get pregnant -breast-feeding How should I use this medicine? Take this medicine by mouth with a full glass of water. Follow the directions on the prescription label. This medicine can be taken with or without food. Take your medicine at regular intervals. Do not take your medicine more often than directed. Take all of your medicine as directed even if you think you are better. Do not skip doses or stop your medicine early. Talk to your pediatrician regarding the use of this medicine in children. While this drug may be prescribed for selected conditions, precautions do apply. Overdosage: If you think you have taken too much of this medicine contact a poison control center or emergency room at once. NOTE: This medicine is only for you. Do not share this medicine with others. What if I miss a dose? If you miss a dose, take it as soon as you can. If it is almost time for your next dose, take only that dose. Do not take double or extra doses.  There should be at least 4 to 6 hours between doses. What may interact with this medicine? -probenecid -some other antibiotics This list may not describe all possible interactions. Give your health care provider a list of all the medicines, herbs, non-prescription drugs, or dietary supplements you use. Also tell them if you smoke, drink alcohol, or use illegal drugs. Some items may interact with your medicine. What  should I watch for while using this medicine? Tell your doctor or health care professional if your symptoms do not begin to improve in a few days. Do not treat diarrhea with over the counter products. Contact your doctor if you have diarrhea that lasts more than 2 days or if it is severe and watery. If you have diabetes, you may get a false-positive result for sugar in your urine. Check with your doctor or health care professional. What side effects may I notice from receiving this medicine? Side effects that you should report to your doctor or health care professional as soon as possible: -allergic reactions like skin rash, itching or hives, swelling of the face, lips, or tongue -breathing problems -pain or trouble passing urine -redness, blistering, peeling or loosening of the skin, including inside the mouth -severe or watery diarrhea -unusually weak or tired -yellowing of the eyes, skin Side effects that usually do not require medical attention (report to your doctor or health care professional if they continue or are bothersome): -gas or heartburn -genital or anal irritation -headache -joint or muscle pain -nausea, vomiting This list may not describe all possible side effects. Call your doctor for medical advice about side effects. You may report side effects to FDA at 1-800-FDA-1088. Where should I keep my medicine? Keep out of the reach of children. Store at room temperature between 59 and 86 degrees F (15 and 30 degrees C). Throw away any unused medicine after the expiration date. NOTE: This sheet is a summary. It may not cover all possible information. If you have questions about this medicine, talk to your doctor, pharmacist, or health care provider.  2018 Elsevier/Gold Standard (2007-06-06 17:09:13)

## 2018-02-11 LAB — URINALYSIS W MICROSCOPIC + REFLEX CULTURE
BILIRUBIN URINE: NEGATIVE
Glucose, UA: NEGATIVE
HGB URINE DIPSTICK: NEGATIVE
Hyaline Cast: NONE SEEN /LPF
KETONES UR: NEGATIVE
NITRITES URINE, INITIAL: NEGATIVE
Protein, ur: NEGATIVE
RBC / HPF: NONE SEEN /HPF (ref 0–2)
SPECIFIC GRAVITY, URINE: 1.006 (ref 1.001–1.03)
SQUAMOUS EPITHELIAL / LPF: NONE SEEN /HPF (ref <=5)
pH: 6.5 (ref 5.0–8.0)

## 2018-02-11 LAB — URINE CULTURE
MICRO NUMBER:: 91435346
SPECIMEN QUALITY:: ADEQUATE

## 2018-02-11 LAB — CULTURE INDICATED

## 2018-02-21 ENCOUNTER — Other Ambulatory Visit: Payer: Self-pay | Admitting: Internal Medicine

## 2018-03-02 ENCOUNTER — Other Ambulatory Visit: Payer: Self-pay

## 2018-03-02 ENCOUNTER — Ambulatory Visit: Payer: Self-pay

## 2018-03-02 ENCOUNTER — Ambulatory Visit (INDEPENDENT_AMBULATORY_CARE_PROVIDER_SITE_OTHER): Payer: PPO

## 2018-03-02 DIAGNOSIS — N390 Urinary tract infection, site not specified: Secondary | ICD-10-CM

## 2018-03-02 DIAGNOSIS — F411 Generalized anxiety disorder: Secondary | ICD-10-CM

## 2018-03-02 DIAGNOSIS — M2012 Hallux valgus (acquired), left foot: Secondary | ICD-10-CM | POA: Diagnosis not present

## 2018-03-02 DIAGNOSIS — M79672 Pain in left foot: Secondary | ICD-10-CM | POA: Diagnosis not present

## 2018-03-02 MED ORDER — PRAVASTATIN SODIUM 40 MG PO TABS: 40.0000 mg | ORAL_TABLET | Freq: Every day | ORAL | 1 refills | Status: DC

## 2018-03-02 MED ORDER — ALPRAZOLAM 0.5 MG PO TABS: ORAL_TABLET | ORAL | 0 refills | Status: DC

## 2018-03-02 NOTE — Telephone Encounter (Signed)
Refill request for Pravastatin and Alprazolam. PMP checked, last filled on 11/16/17

## 2018-03-02 NOTE — Progress Notes (Signed)
Patient presents to the office for a nurse visit follow up visit and to provide urine sample for recurring UTI. Patient had completed antibiotics and states that she does not have any signs or symptoms. No other changes with medications.

## 2018-03-03 LAB — URINE CULTURE
MICRO NUMBER:: 91515746
SPECIMEN QUALITY:: ADEQUATE

## 2018-03-03 LAB — URINALYSIS, ROUTINE W REFLEX MICROSCOPIC
Bacteria, UA: NONE SEEN /HPF
Bilirubin Urine: NEGATIVE
GLUCOSE, UA: NEGATIVE
Hgb urine dipstick: NEGATIVE
Hyaline Cast: NONE SEEN /LPF
Ketones, ur: NEGATIVE
Nitrite: NEGATIVE
Protein, ur: NEGATIVE
RBC / HPF: NONE SEEN /HPF (ref 0–2)
Specific Gravity, Urine: 1.006 (ref 1.001–1.03)
Squamous Epithelial / HPF: NONE SEEN /HPF (ref <=5)
WBC, UA: NONE SEEN /HPF (ref 0–5)
pH: 6 (ref 5.0–8.0)

## 2018-03-26 ENCOUNTER — Other Ambulatory Visit: Payer: Self-pay | Admitting: Physician Assistant

## 2018-04-08 DIAGNOSIS — M810 Age-related osteoporosis without current pathological fracture: Secondary | ICD-10-CM | POA: Diagnosis not present

## 2018-04-08 DIAGNOSIS — Z1231 Encounter for screening mammogram for malignant neoplasm of breast: Secondary | ICD-10-CM | POA: Diagnosis not present

## 2018-04-08 LAB — HM MAMMOGRAPHY

## 2018-04-08 LAB — HM DEXA SCAN

## 2018-04-09 IMAGING — CR DG FOOT COMPLETE 3+V*L*
3 series · 3 of 3 positions shown · non-contrast
Comparison: None.

CLINICAL DATA: Stepped on object 2 days ago, foot pain

EXAM:
LEFT FOOT - COMPLETE 3+ VIEW

[foot ap]
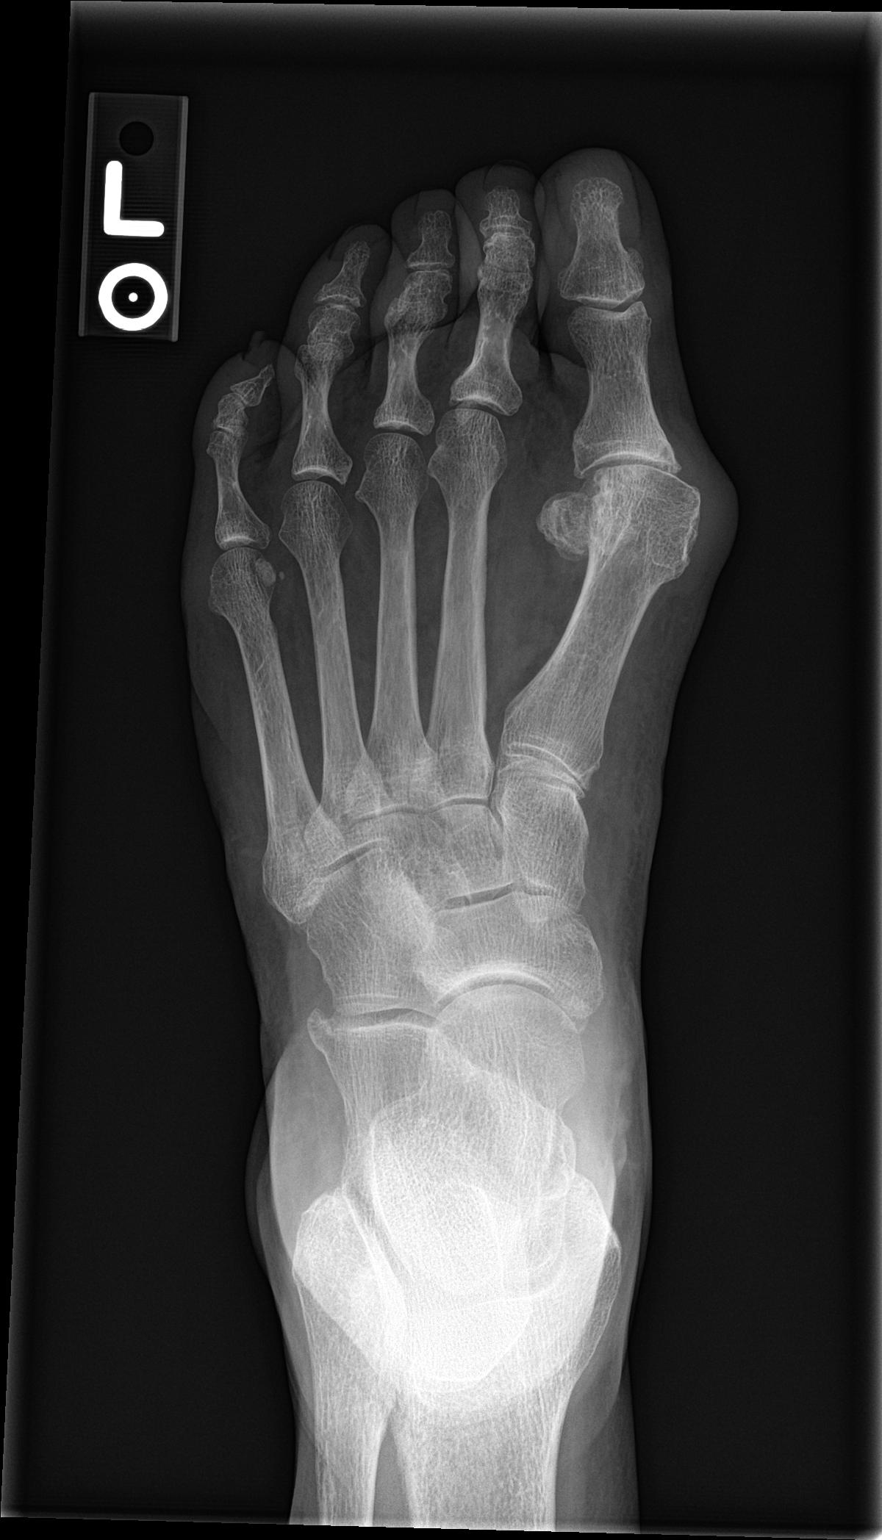

[foot obl]
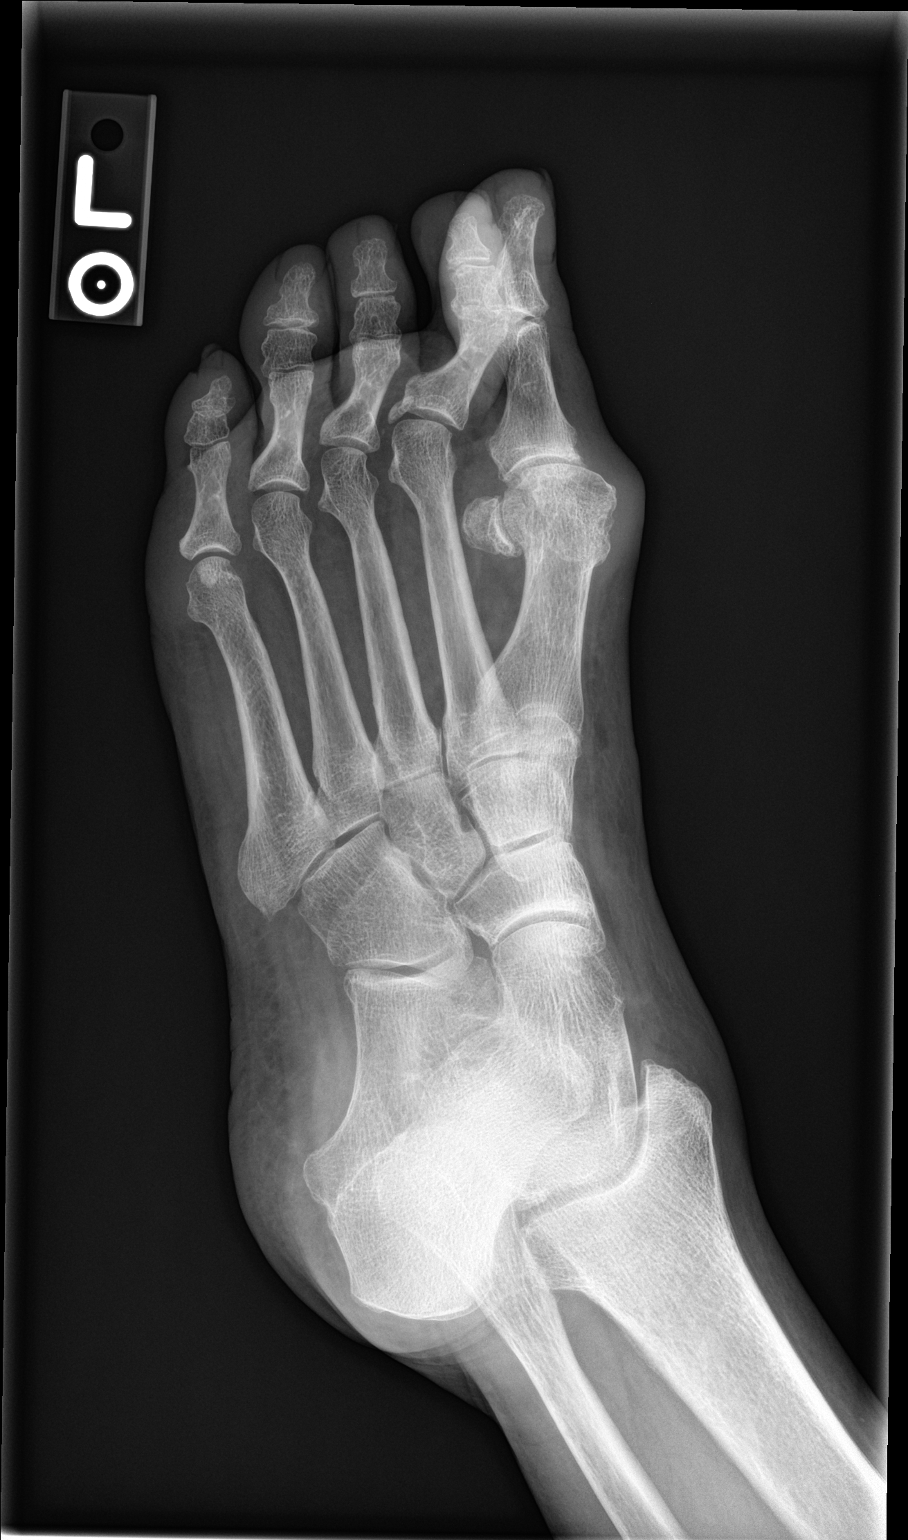

[foot lat]
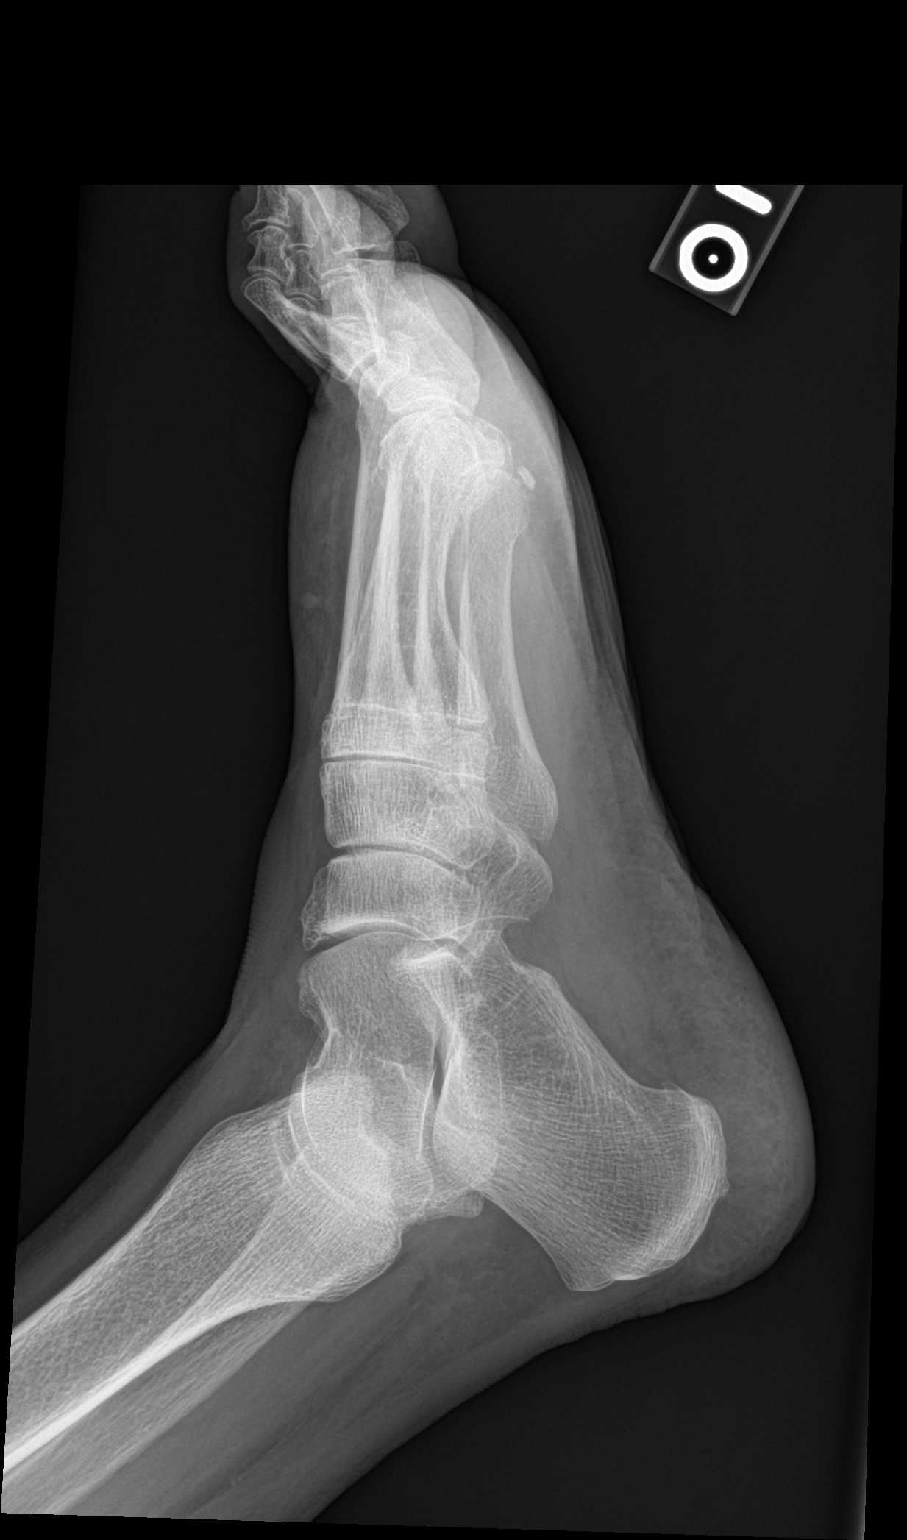

[3 of 3 positions shown; findings below may reference images not displayed]

FINDINGS: No fracture or dislocation is seen.

Degenerative changes of the 1st MTP joint with associated hallux
valgus deformity.

Mild degenerative changes of the dorsal midfoot.

Visualized soft tissues are within normal limits.

No radiopaque foreign body is seen.
IMPRESSION: No fracture, dislocation, or radiopaque foreign body is seen.

## 2018-04-11 ENCOUNTER — Encounter: Payer: Self-pay | Admitting: Internal Medicine

## 2018-04-11 ENCOUNTER — Other Ambulatory Visit: Payer: Self-pay | Admitting: Adult Health

## 2018-04-11 ENCOUNTER — Encounter: Payer: Self-pay | Admitting: Adult Health

## 2018-04-11 DIAGNOSIS — M810 Age-related osteoporosis without current pathological fracture: Secondary | ICD-10-CM | POA: Insufficient documentation

## 2018-04-11 MED ORDER — ALENDRONATE SODIUM 70 MG PO TABS: 70.0000 mg | ORAL_TABLET | ORAL | 3 refills | Status: DC

## 2018-05-17 ENCOUNTER — Other Ambulatory Visit: Payer: Self-pay | Admitting: Internal Medicine

## 2018-05-23 NOTE — Progress Notes (Signed)
WELLNESS and follow up  Assessment:   Essential hypertension - continue medications, DASH diet, exercise and monitor at home. Call if greater than 130/80.  -     CBC with Differential/Platelet -     BASIC METABOLIC PANEL WITH GFR -     Hepatic function panel -     TSH  Mixed hyperlipidemia -continue medications, check lipids, decrease fatty foods, increase activity.  -     Lipid panel  Prediabetes Discussed general issues about diabetes pathophysiology and management., Educational material distributed., Suggested low cholesterol diet., Encouraged aerobic exercise., Discussed foot care., Reminded to get yearly retinal exam. -     Hemoglobin A1c  Vitamin D deficiency Continue supplement  Medication management -     Magnesium  Recurrent major depressive disorder, in partial remission (HCC) - continue medications, stress management techniques discussed, increase water, good sleep hygiene discussed, increase exercise, and increase veggies.   Recurrent UTI monitor  Class 1 obesity due to excess calories with serious comorbidity and body mass index (BMI) of 31.0 to 31.9 in adult - long discussion about weight loss, diet, and exercise  Ocular herpes zoster Continue follow up eye doctor  Family history of AAA, HTN Will get screening  Over 30 minutes of exam, counseling, chart review and critical decision making was performed Future Appointments  Date Time Provider Department Center  05/30/2019 10:00 AM Quentin Mulling, PA-C [id number] None    Plan:   During the course of the visit the patient was educated and counseled about appropriate screening and preventive services including:    Pneumococcal vaccine   Prevnar 13  Influenza vaccine  Td vaccine  Screening electrocardiogram  Bone densitometry screening  Colorectal cancer screening  Diabetes screening  Glaucoma screening  Nutrition counseling   Advanced directives: requested    Subjective:  Allison Barker is a 73 y.o. female who presents for Wellness and follow up.    Her blood pressure has been controlled at home, today their BP is BP: 124/76 She does not workout. She denies chest pain, shortness of breath, dizziness.  She is on cholesterol medication and denies myalgias. Her cholesterol is at goal. The cholesterol last visit was:   Lab Results  Component Value Date   CHOL 165 12/03/2017   HDL 42 (L) 12/03/2017   LDLCALC 77 12/03/2017   TRIG 384 (H) 12/03/2017   CHOLHDL 3.9 12/03/2017   Last N8G normal range, but has been in the preDM range in the past.  Lab Results  Component Value Date   HGBA1C 6.2 (H) 12/03/2017   Last GFR: Lab Results  Component Value Date   GFRNONAA 73 12/03/2017   Patient is on Vitamin D supplement.   Lab Results  Component Value Date   VD25OH 48 02/05/2016     BMI is Body mass index is 31.63 kg/m., she is working on diet and exercise. Wt Readings from Last 3 Encounters:  05/24/18 181 lb 6.4 oz (82.3 kg)  02/09/18 184 lb 9.6 oz (83.7 kg)  12/03/17 183 lb 9.6 oz (83.3 kg)   She has ocular herpes, on acyclovir for this and follows Dr. Noel Gerold every 6 months.  She had right big toe nail removed, she has bilateral hammer toes, has some blister/redness on 2nd medial toe.   Medication Review: Current Outpatient Medications on File Prior to Visit  Medication Sig  . acyclovir (ZOVIRAX) 400 MG tablet Take 400 mg by mouth 3 (three) times daily.   Marland Kitchen alendronate (FOSAMAX) 70 MG  tablet Take 1 tablet (70 mg total) by mouth once a week. Take with a full glass of water on an empty stomach.  . ALPRAZolam (XANAX) 0.5 MG tablet TAKE 1 TABLET BY MOUTH AT BEDTIME AS NEEDED FOR ANXIETY  . aspirin 81 MG tablet Take 81 mg by mouth daily.   . bisoprolol-hydrochlorothiazide (ZIAC) 5-6.25 MG tablet TAKE 1 TABLET BY MOUTH ONCE DAILY FOR BLOOD PRESSURE  . cephALEXin (KEFLEX) 500 MG capsule Take 1 capsule 2 x/day after meals  . cetirizine (ZYRTEC) 10 MG tablet Take 10 mg  by mouth daily.  . Cholecalciferol (VITAMIN D PO) Take 2,000 Units by mouth 3 (three) times daily.  . Coenzyme Q10 (COQ10 PO) Take 1 capsule by mouth daily.  . furosemide (LASIX) 40 MG tablet TAKE 1 TABLET BY MOUTH DAILY  . Multiple Vitamins-Minerals (MULTIVITAMIN PO) Take by mouth 2 (two) times daily.  Marland Kitchen OVER THE COUNTER MEDICATION Calcium 2 tabs per day  . OVER THE COUNTER MEDICATION 1 capsule daily. Tumeric  . pravastatin (PRAVACHOL) 40 MG tablet Take 1 tablet (40 mg total) by mouth daily.  . prednisoLONE acetate (PRED FORTE) 1 % ophthalmic suspension   . terbinafine (LAMISIL) 250 MG tablet TAKE 1 TABLET BY MOUTH DAILY  . valACYclovir (VALTREX) 500 MG tablet Take 500 mg by mouth 3 (three) times daily.   No current facility-administered medications on file prior to visit.      Allergies  Allergen Reactions  . Ppd [Tuberculin Purified Protein Derivative]     Current Problems (verified) Patient Active Problem List   Diagnosis Date Noted  . Osteoporosis 04/11/2018  . Depression 01/28/2015  . Medication management 10/03/2014  . Obesity 03/21/2014  . Recurrent UTI 03/21/2014  . Hypertension   . Hyperlipidemia   . Vitamin D deficiency   . Abnormal glucose     Screening Tests Immunization History  Administered Date(s) Administered  . DT 01/08/2015  . Hepatitis B 03/16/2001, 09/22/2001  . Influenza, High Dose Seasonal PF 11/13/2013, 01/08/2015, 12/25/2015, 01/13/2017, 12/03/2017  . Influenza,inj,quad, With Preservative 12/14/2017  . Pneumococcal Conjugate-13 04/12/2014, 07/23/2016  . Pneumococcal Polysaccharide-23 12/25/2010  . Td 06/17/2004  . Zoster 07/08/2010   Preventative care: Tetanus: 01/08/15 Pneumovax:12/25/2010 Prevnar 13: 2014 at [facility] Flu vaccine: 2019 Zostavax:07/08/10  MGM: 1/20203D DEXA: 03/2018 left femoral neck T -2.9 on fosamax x 03/2018 Colonoscopy: 10/06/13 due 8 years CXR 2013  Names of Other Physician/Practitioners you currently use: 1.  [facility] here for primary care 2. Cresenciano Genre, eye doctor, last visit 12/2015 has OV Patient Care Team: Lucky Cowboy, MD as PCP - General (Internal Medicine) Vida Rigger, MD as Consulting Physician (Gastroenterology) Elpidio Galea, MD as Consulting Physician (Ophthalmology) Sallye Lat, MD as Consulting Physician (Optometry) Arminda Resides, MD as Consulting Physician (Dermatology)  SURGICAL HISTORY She  has a past surgical history that includes Tonsillectomy and adenoidectomy and Eye surgery (Left, 11/2012). FAMILY HISTORY Her family history includes Diabetes in her father; Heart disease in her mother; Hyperlipidemia in her mother; Hypertension in her father. SOCIAL HISTORY She  reports that she has never smoked. She has never used smokeless tobacco.  MEDICARE WELLNESS OBJECTIVES: Physical activity: Current Exercise Habits: The patient does not participate in regular exercise at present, Exercise limited by: orthopedic condition(s)(foot pain) Cardiac risk factors: Cardiac Risk Factors include: advanced age (>8men, >1 women);dyslipidemia;hypertension;obesity (BMI >30kg/m2);sedentary lifestyle Depression/mood screen:   Depression screen [facility] 2/9 05/24/2018  Decreased Interest 0  Down, Depressed, Hopeless 0  PHQ - 2 Score  0  Altered sleeping -  Tired, decreased energy -  Change in appetite -  Feeling bad or failure about yourself  -  Trouble concentrating -  Moving slowly or fidgety/restless -  Suicidal thoughts -  PHQ-9 Score -  Difficult doing work/chores -    ADLs:  In your present state of health, do you have any difficulty performing the following activities: 05/24/2018  Hearing? N  Vision? N  Difficulty concentrating or making decisions? N  Walking or climbing stairs? N  Comment foot will hurt her  Dressing or bathing? N  Doing errands, shopping? N  Some recent data might be hidden    Cognitive Testing  Alert? Yes   Normal Appearance?Yes  Oriented to person? Yes  Place? Yes   Time? Yes  Recall of three objects?  Yes  Can perform simple calculations? Yes  Displays appropriate judgment?Yes  Can read the correct time from a watch face?Yes  EOL planning: Does Patient Have a Medical Advance Directive?: Yes Type of Advance Directive: Healthcare Power of Attorney, Living will Copy of Healthcare Power of Attorney in Chart?: No - copy requested    Review of Systems  Constitutional: Negative for chills, fever and malaise/fatigue.  HENT: Negative for congestion, ear pain and sore throat.   Eyes: Negative.   Respiratory: Negative for cough, shortness of breath and wheezing.   Cardiovascular: Negative for chest pain, palpitations and leg swelling.  Gastrointestinal: Negative for abdominal pain, blood in stool, constipation, diarrhea, heartburn and melena.  Genitourinary: Negative.   Skin: Negative.   Neurological: Negative for dizziness, sensory change, loss of consciousness and headaches.  Psychiatric/Behavioral: Negative for depression. The patient is not nervous/anxious and does not have insomnia.      Objective:     Today's Vitals   05/24/18 1006  BP: 124/76  Pulse: 68  Temp: 98 F (36.7 C)  SpO2: 98%  Weight: 181 lb 6.4 oz (82.3 kg)  Height: 5' 3.5" (1.613 m)  PainSc: 2   PainLoc: Foot   Body mass index is 31.63 kg/m.  General appearance: alert, no distress, WD/WN, female HEENT: ecchymosis left eye lid from rolling out of bed normocephalic, sclerae anicteric, TMs pearly, nares patent, no discharge or erythema, pharynx normal Oral cavity: MMM, no lesions Neck: supple, no lymphadenopathy, no thyromegaly, no masses Heart: RRR, normal S1, S2, no murmurs Lungs: CTA bilaterally, no wheezes, rhonchi, or rales Abdomen: +bs, soft, non tender, non distended, no masses, no hepatomegaly, no splenomegaly Musculoskeletal: nontender, no swelling, no obvious deformity Extremities: no edema, no  cyanosis, no clubbing, right great toenail thick, brittle, no warmth, erythema Pulses: 2+ symmetric, upper and lower extremities, normal cap refill Neurological: alert, oriented x 3, CN2-12 intact, strength normal upper extremities and lower extremities, sensation normal throughout, DTRs 2+ throughout, no cerebellar signs, gait normal Psychiatric: normal affect, behavior normal, pleasant    Medicare Attestation I have personally reviewed: The patient's medical and social history Their use of alcohol, tobacco or illicit drugs Their current medications and supplements The patient's functional ability including ADLs,fall risks, home safety risks, cognitive, and hearing and visual impairment Diet and physical activities Evidence for depression or mood disorders  The patient's weight, height, BMI, and visual acuity have been recorded in the chart.  I have made referrals, counseling, and provided education to the patient based on review of the above and I have provided the patient with a written personalized care plan for preventive services.      Quentin Mulling, PA-C  05/24/2018    

## 2018-05-24 ENCOUNTER — Other Ambulatory Visit: Payer: Self-pay

## 2018-05-24 ENCOUNTER — Encounter: Payer: Self-pay | Admitting: Physician Assistant

## 2018-05-24 ENCOUNTER — Ambulatory Visit (INDEPENDENT_AMBULATORY_CARE_PROVIDER_SITE_OTHER): Payer: PPO | Admitting: Physician Assistant

## 2018-05-24 VITALS — BP 124/76 | HR 68 | Temp 98.0°F | Ht 63.5 in | Wt 181.4 lb

## 2018-05-24 DIAGNOSIS — R6889 Other general symptoms and signs: Secondary | ICD-10-CM | POA: Diagnosis not present

## 2018-05-24 DIAGNOSIS — E782 Mixed hyperlipidemia: Secondary | ICD-10-CM

## 2018-05-24 DIAGNOSIS — M810 Age-related osteoporosis without current pathological fracture: Secondary | ICD-10-CM

## 2018-05-24 DIAGNOSIS — Z6831 Body mass index (BMI) 31.0-31.9, adult: Secondary | ICD-10-CM | POA: Diagnosis not present

## 2018-05-24 DIAGNOSIS — I10 Essential (primary) hypertension: Secondary | ICD-10-CM

## 2018-05-24 DIAGNOSIS — E559 Vitamin D deficiency, unspecified: Secondary | ICD-10-CM

## 2018-05-24 DIAGNOSIS — N390 Urinary tract infection, site not specified: Secondary | ICD-10-CM | POA: Diagnosis not present

## 2018-05-24 DIAGNOSIS — R7309 Other abnormal glucose: Secondary | ICD-10-CM

## 2018-05-24 DIAGNOSIS — Z79899 Other long term (current) drug therapy: Secondary | ICD-10-CM

## 2018-05-24 DIAGNOSIS — Z0000 Encounter for general adult medical examination without abnormal findings: Secondary | ICD-10-CM

## 2018-05-24 DIAGNOSIS — F3341 Major depressive disorder, recurrent, in partial remission: Secondary | ICD-10-CM | POA: Diagnosis not present

## 2018-05-24 DIAGNOSIS — Z0001 Encounter for general adult medical examination with abnormal findings: Secondary | ICD-10-CM | POA: Diagnosis not present

## 2018-05-24 DIAGNOSIS — E6609 Other obesity due to excess calories: Secondary | ICD-10-CM | POA: Diagnosis not present

## 2018-05-24 DIAGNOSIS — Z8249 Family history of ischemic heart disease and other diseases of the circulatory system: Secondary | ICD-10-CM

## 2018-05-24 MED ORDER — ESCITALOPRAM OXALATE 20 MG PO TABS: ORAL_TABLET | ORAL | 1 refills | Status: DC

## 2018-05-24 NOTE — Patient Instructions (Addendum)
Breaking a bone can be serious, especially if the bone is in the hip. People who break a hip sometimes lose the ability to walk on their own. Many of them end up in a nursing home. That's why it is so important to avoid breaking a bone in the first place.   What can I do to keep my bones as healthy as possible? - You can: ?Eat foods with a lot of calcium, such as milk, yogurt, and green leafy vegetables  ?Eat foods with a lot of vitamin D, such as milk that has vitamin D added, and fish from the ocean  ?Take calcium and vitamin D pills (if you do not get enough from the food that you eat) ?Be active for at least 30 minutes, most days of the week ?Avoid smoking  ?Limit the amount of alcohol you drink to 1 to 2 drinks a day at most  What do osteoporosis medicines do? - If you have osteoporosis or a high risk of breaking a bone, the medicines your doctor prescribes can: ?Reduce bone loss  ?Increase bone density or keep it about the same ?Reduce the chances that you will break a bone   Bisphosphonates - Most people being treated for osteoporosis take these medicines first. If they do not work well enough or cause side effects that are too hard to handle, there are other options.   Most people take one pill every week. If your doctor prescribes a bisphosphonate pill, you must take the medicine exactly as directed. If you don't, the medicine can irritate your throat or stomach. For most bisphosphonate pills, you must:  ?Take the pill first thing in the morning, before you have anything to eat or drink.  ?Drink an 8-ounce glass of water with the pill, but not eat or drink anything else for 30 minutes or 1 hour (depending on which pill you take).  ?Avoid lying down for 30 minutes after taking the pill. (You must sit or stand during that time).     Coronavirus or Covid 19  . You will use the same precautions as you would with the flu virus.  . While we do not have a vaccine against Covid 19 and  will not for several years, WE do have a vaccine against the flu. Please get this if you have not yet.  Marland Kitchen Please wash your hands frequently. Reyes Ivan your hands before you eat or touch your face.  . Avoid touching your face, eyes, nose, mouth as much as possible.  . Routinely clean frequently touched items such as doorknobs, keyboards, and phones.  . Avoid crowds of people.   To get more information from reputable sources:  You can call this hotline set up by [facility] 1 877 40COVID (580-413-1828)  You can visit these websites: CDC.gov WHO.int  If you feel that you have come in contact with someone that may be infected with coronavirus.  Please stay at home.  Call our office.  Call the local health department.  [facility] Department [phone])  After hours nurse triage line [phone])      When it comes to diets, agreement about the perfect plan isn't easy to find, even among the experts. Experts at the [facility] [employer] developed an idea known as the Healthy Eating Plate. Just imagine a plate divided into logical, healthy portions.  The emphasis is on diet quality:  Load up on vegetables and fruits - one-half of your plate: Aim for color  and variety, and remember that potatoes don't count.  Go for whole grains - one-quarter of your plate: Whole wheat, barley, wheat berries, quinoa, oats, brown rice, and foods made with them. If you want pasta, go with whole wheat pasta.  Protein power - one-quarter of your plate: Fish, chicken, beans, and nuts are all healthy, versatile protein sources. Limit red meat.  The diet, however, does go beyond the plate, offering a few other suggestions.  Use healthy plant oils, such as olive, canola, soy, corn, sunflower and peanut. Check the labels, and avoid partially hydrogenated oil, which have unhealthy trans fats.  If you're thirsty, drink water. Coffee and tea are good in moderation, but skip sugary drinks and  limit milk and dairy products to one or two daily servings.  The type of carbohydrate in the diet is more important than the amount. Some sources of carbohydrates, such as vegetables, fruits, whole grains, and beans-are healthier than others.  Finally, stay active.

## 2018-05-25 LAB — LIPID PANEL
Cholesterol: 192 mg/dL (ref <200)
HDL: 48 mg/dL — ABNORMAL LOW (ref >=50)
LDL Cholesterol (Calc): 101 mg/dL (calc) — ABNORMAL HIGH
Non-HDL Cholesterol (Calc): 144 mg/dL (calc) — ABNORMAL HIGH (ref <130)
TRIGLYCERIDES: 323 mg/dL — AB (ref <150)
Total CHOL/HDL Ratio: 4 (calc) (ref <5.0)

## 2018-05-25 LAB — COMPLETE METABOLIC PANEL WITH GFR
AG Ratio: 1.3 (calc) (ref 1.0–2.5)
ALT: 18 U/L (ref 6–29)
AST: 25 U/L (ref 10–35)
Albumin: 4 g/dL (ref 3.6–5.1)
Alkaline phosphatase (APISO): 67 U/L (ref 37–153)
BUN: 12 mg/dL (ref 7–25)
CO2: 26 mmol/L (ref 20–32)
Calcium: 9.1 mg/dL (ref 8.6–10.4)
Chloride: 100 mmol/L (ref 98–110)
Creat: 0.91 mg/dL (ref 0.60–0.93)
GFR, Est African American: 73 mL/min/{1.73_m2} (ref >=60)
GFR, Est Non African American: 63 mL/min/{1.73_m2} (ref >=60)
Globulin: 3.2 g/dL (calc) (ref 1.9–3.7)
Glucose, Bld: 116 mg/dL — ABNORMAL HIGH (ref 65–99)
Potassium: 3.9 mmol/L (ref 3.5–5.3)
Sodium: 137 mmol/L (ref 135–146)
TOTAL PROTEIN: 7.2 g/dL (ref 6.1–8.1)
Total Bilirubin: 0.5 mg/dL (ref 0.2–1.2)

## 2018-05-25 LAB — CBC WITH DIFFERENTIAL/PLATELET
Absolute Monocytes: 847 cells/uL (ref 200–950)
Basophils Absolute: 58 cells/uL (ref 0–200)
Basophils Relative: 0.7 %
Eosinophils Absolute: 191 cells/uL (ref 15–500)
Eosinophils Relative: 2.3 %
HCT: 45.5 % — ABNORMAL HIGH (ref 35.0–45.0)
Hemoglobin: 15.6 g/dL — ABNORMAL HIGH (ref 11.7–15.5)
Lymphs Abs: 1909 cells/uL (ref 850–3900)
MCH: 32.8 pg (ref 27.0–33.0)
MCHC: 34.3 g/dL (ref 32.0–36.0)
MCV: 95.6 fL (ref 80.0–100.0)
MPV: 9.7 fL (ref 7.5–12.5)
Monocytes Relative: 10.2 %
Neutro Abs: 5295 cells/uL (ref 1500–7800)
Neutrophils Relative %: 63.8 %
Platelets: 318 10*3/uL (ref 140–400)
RBC: 4.76 10*6/uL (ref 3.80–5.10)
RDW: 12.9 % (ref 11.0–15.0)
Total Lymphocyte: 23 %
WBC: 8.3 10*3/uL (ref 3.8–10.8)

## 2018-05-25 LAB — MAGNESIUM: Magnesium: 2 mg/dL (ref 1.5–2.5)

## 2018-05-25 LAB — TSH: TSH: 3.4 mIU/L (ref 0.40–4.50)

## 2018-05-25 LAB — HEMOGLOBIN A1C
Hgb A1c MFr Bld: 6.2 % of total Hgb — ABNORMAL HIGH (ref <5.7)
Mean Plasma Glucose: 131 (calc)
eAG (mmol/L): 7.3 (calc)

## 2018-06-22 ENCOUNTER — Other Ambulatory Visit: Payer: Self-pay | Admitting: Physician Assistant

## 2018-07-07 ENCOUNTER — Other Ambulatory Visit: Payer: Self-pay | Admitting: Adult Health

## 2018-07-07 DIAGNOSIS — F411 Generalized anxiety disorder: Secondary | ICD-10-CM

## 2018-08-24 ENCOUNTER — Other Ambulatory Visit: Payer: Self-pay | Admitting: Internal Medicine

## 2018-08-24 ENCOUNTER — Other Ambulatory Visit: Payer: Self-pay | Admitting: Adult Health

## 2018-08-30 ENCOUNTER — Ambulatory Visit: Payer: Self-pay | Admitting: Internal Medicine

## 2018-09-14 ENCOUNTER — Telehealth: Payer: Self-pay

## 2018-09-14 NOTE — Telephone Encounter (Signed)
Patient has been informed of information.  See message

## 2018-09-14 NOTE — Telephone Encounter (Signed)
-----   Message from Vicie Mutters, [place] sent at 09/14/2018  8:11 AM EDT ----- Regarding: RE: side effects Contact: [phone] I agree with you Langley Gauss for her to stop for now.  If she is unable to tolerate this medication, we can refer her to a specialist that does the injectable called prolia. It is very effective for low bone mass. We can also discuss at her next office visit.  Continue vitamin D, start weight bearing exercises and we will continue to monitor.   Thanks, Estill Bamberg ----- Message ----- From: Elenor Quinones, CMA Sent: 09/13/2018  12:23 PM EDT To: Vicie Mutters, PA-C Subject: side effects                                   Patient called to report that she was having some poss side effects to [facility].  Patient she was given this med by [facility] Due to DEXA report.  Patient requested advise from you........she reports having right arm/shoulder MUSCLE soreness. (More at the very top of the right arm/shoulder area)  It's sore on the left side groin area as well. She did state again its more of a soreness than pain.  No fall/injuries  Patient states that she read in the leaflet that this is a poss side effect. She states that she does not want to live like this & would like a different medication if she can.  Since patient felt like she was having side effects from the medication and since holding meds would not cause life threaten sxs. I ask the patient to stop until I spoke with you & ascertain what you want to be done.   Please advise.

## 2018-10-07 DIAGNOSIS — M25511 Pain in right shoulder: Secondary | ICD-10-CM | POA: Diagnosis not present

## 2018-10-07 DIAGNOSIS — M25551 Pain in right hip: Secondary | ICD-10-CM | POA: Diagnosis not present

## 2018-10-26 DIAGNOSIS — M25551 Pain in right hip: Secondary | ICD-10-CM | POA: Diagnosis not present

## 2018-10-31 ENCOUNTER — Other Ambulatory Visit: Payer: Self-pay | Admitting: Adult Health

## 2018-11-28 ENCOUNTER — Other Ambulatory Visit: Payer: Self-pay | Admitting: Physician Assistant

## 2018-11-28 ENCOUNTER — Other Ambulatory Visit: Payer: Self-pay | Admitting: Adult Health

## 2018-12-01 ENCOUNTER — Other Ambulatory Visit: Payer: Self-pay

## 2018-12-01 ENCOUNTER — Ambulatory Visit (INDEPENDENT_AMBULATORY_CARE_PROVIDER_SITE_OTHER): Payer: PPO

## 2018-12-01 VITALS — Temp 97.3°F

## 2018-12-01 DIAGNOSIS — Z23 Encounter for immunization: Secondary | ICD-10-CM

## 2018-12-01 NOTE — Progress Notes (Signed)
Patient presents to the office forHDFlu Vaccine. Vaccine administered toLEFTDeltoid withoutanycomplication. Temperature taken and recorded 

## 2018-12-13 ENCOUNTER — Other Ambulatory Visit: Payer: Self-pay | Admitting: Internal Medicine

## 2018-12-13 DIAGNOSIS — F411 Generalized anxiety disorder: Secondary | ICD-10-CM

## 2018-12-27 ENCOUNTER — Other Ambulatory Visit: Payer: Self-pay | Admitting: Physician Assistant

## 2019-01-03 ENCOUNTER — Ambulatory Visit: Payer: PPO | Admitting: Adult Health

## 2019-01-03 NOTE — Progress Notes (Signed)
3 month follow up  Assessment:   Essential hypertension - continue medications, DASH diet, exercise and monitor at home. Call if greater than 130/80.  -     CBC with Differential/Platelet -     CMP/GFR -     TSH  Mixed hyperlipidemia -continue medications, check lipids, decrease fatty foods, increase activity.  -     Lipid panel  Prediabetes Discussed general issues about diabetes pathophysiology and management., Educational material distributed., Suggested low cholesterol diet., Encouraged aerobic exercise., Discussed foot care., Reminded to get yearly retinal exam. -     Hemoglobin A1c  Vitamin D deficiency Continue supplement, check levels   Medication management -     Magnesium  Recurrent major depressive disorder, in partial remission (HCC) - continue medications, reminded to limit benzo to <5 days/week - stress management techniques discussed, increase water, good sleep hygiene discussed, increase exercise, and increase veggies.   Class 1 obesity due to excess calories with serious comorbidity and body mass index (BMI) of 31.0 to 31.9 in adult - long discussion about weight loss, diet, and exercise  Ocular herpes zoster Continue follow up eye doctor  Atopic dermatitis - triamcinolone 0.1% cream BID - lifestyle reviewed, information provided on AVS  Over 30 minutes of exam, counseling, chart review and critical decision making was performed Future Appointments  Date Time Provider Department Center  05/30/2019 10:00 AM Quentin Mullingollier, Amanda, PA-C [facility] None    Plan:   During the course of the visit the patient was educated and counseled about appropriate screening and preventive services including:    Pneumococcal vaccine   Prevnar 13  Influenza vaccine  Td vaccine  Screening electrocardiogram  Bone densitometry screening  Colorectal cancer screening  Diabetes screening  Glaucoma screening  Nutrition counseling   Advanced directives:  requested    Subjective:  Allison Barker is a 73 y.o. female who presents for follow up.   She and her husband are in the process of moving [place], [place].   She has ocular herpes, on acyclovir for this and follows Dr. Noel Geroldohen every 6 months.  She had right big toe nail removed, she has bilateral hammer toes, has some blister/redness on 2nd medial toe. Has completed multiple courses of terbinafine without resolution of thickened/yellow 1st toe nail. Have discussed podiatry but wishes to defer at this time.   She has major depression, recurrent, in partial remission with lexapro 20 mg daily; she is also prescribed xanax, currently taking 1/2 tab most evenings, anxiety during the day is well controlled.   BMI is Body mass index is 31.7 kg/m., she has not been working on diet and exercise, but [place] and plans to start walking once she moves.  Wt Readings from Last 3 Encounters:  01/04/19 181 lb 12.8 oz (82.5 kg)  05/24/18 181 lb 6.4 oz (82.3 kg)  02/09/18 184 lb 9.6 oz (83.7 kg)   She has not been checking blood pressure at home, today their BP is BP: 124/80 She does not workout. She denies chest pain, shortness of breath, dizziness.   She is on cholesterol medication and denies myalgias. Her cholesterol is at goal. The cholesterol last visit was:   Lab Results  Component Value Date   CHOL 192 05/24/2018   HDL 48 (L) 05/24/2018   LDLCALC 101 (H) 05/24/2018   TRIG 323 (H) 05/24/2018   CHOLHDL 4.0 05/24/2018    She has not been working on diet and exercise for prediabetes, and  denies increased appetite, nausea, paresthesia of the feet, polydipsia, polyuria and visual disturbances. Last A1C in the office was:  Lab Results  Component Value Date   HGBA1C 6.2 (H) 05/24/2018   Last GFR: Lab Results  Component Value Date   GFRNONAA 63 05/24/2018   Patient is on Vitamin D supplement, taking 3 x 2000 IU   Lab Results  Component Value Date   VD25OH 48 02/05/2016       Medication Review: Current Outpatient Medications on File Prior to Visit  Medication Sig  . acyclovir (ZOVIRAX) 400 MG tablet Take 400 mg by mouth 3 (three) times daily.   Marland Kitchen alendronate (FOSAMAX) 70 MG tablet Take 1 tablet (70 mg total) by mouth once a week. Take with a full glass of water on an empty stomach.  . ALPRAZolam (XANAX) 0.5 MG tablet TAKE 1/2 TO 1 TABLET BY MOUTH AT BEDTIMEONLY IF NEEDED FOR INSOMNIA  . aspirin 81 MG tablet Take 81 mg by mouth daily.   . bisoprolol-hydrochlorothiazide (ZIAC) 5-6.25 MG tablet Take 1 tablet Daily for BP  . cetirizine (ZYRTEC) 10 MG tablet Take 10 mg by mouth daily.  . Cholecalciferol (VITAMIN D PO) Take 2,000 Units by mouth 3 (three) times daily.  . Coenzyme Q10 (COQ10 PO) Take 1 capsule by mouth daily.  Marland Kitchen escitalopram (LEXAPRO) 20 MG tablet Take 1 tablet Daily for Mood  . furosemide (LASIX) 40 MG tablet Take 1 tablet Daily for BP / Fluid Retention / Ankle Swelling  . Multiple Vitamins-Minerals (MULTIVITAMIN PO) Take by mouth 2 (two) times daily.  Marland Kitchen OVER THE COUNTER MEDICATION Calcium 2 tabs per day  . OVER THE COUNTER MEDICATION 1 capsule daily. Tumeric  . pravastatin (PRAVACHOL) 40 MG tablet Take 1 tablet at Bedtime for Cholesterol  . prednisoLONE acetate (PRED FORTE) 1 % ophthalmic suspension   . valACYclovir (VALTREX) 500 MG tablet Take 500 mg by mouth 3 (three) times daily.   No current facility-administered medications on file prior to visit.      Allergies  Allergen Reactions  . Ppd [Tuberculin Purified Protein Derivative]     Current Problems (verified) Patient Active Problem List   Diagnosis Date Noted  . Osteoporosis 04/11/2018  . Major depression, recurrent, in partial remission  (HCC) 01/28/2015  . Medication management 10/03/2014  . Obesity 03/21/2014  . Recurrent UTI 03/21/2014  . Hypertension   . Hyperlipidemia   . Vitamin D deficiency   . Abnormal glucose     Screening Tests Immunization History   Administered Date(s) Administered  . DT 01/08/2015  . Hepatitis B 03/16/2001, 09/22/2001  . Influenza, High Dose Seasonal PF 11/13/2013, 01/08/2015, 12/25/2015, 01/13/2017, 12/03/2017, 12/01/2018  . Influenza,inj,quad, With Preservative 12/14/2017  . Pneumococcal Conjugate-13 04/12/2014, 07/23/2016  . Pneumococcal Polysaccharide-23 12/25/2010  . Td 06/17/2004  . Zoster 07/08/2010    SURGICAL HISTORY She  has a past surgical history that includes Tonsillectomy and adenoidectomy and Eye surgery (Left, 11/2012). FAMILY HISTORY Her family history includes AAA (abdominal aortic aneurysm) in her mother; Diabetes in her father; Heart disease in her mother; Hyperlipidemia in her mother; Hypertension in her father. SOCIAL HISTORY She  reports that she has never smoked. She has never used smokeless tobacco.    Review of Systems  Constitutional: Negative for chills, fever and malaise/fatigue.  HENT: Negative for congestion, ear pain and sore throat.   Eyes: Negative.   Respiratory: Negative for cough, shortness of breath and wheezing.   Cardiovascular: Negative for chest pain, palpitations and leg swelling.  Gastrointestinal: Negative for abdominal pain, blood in stool, constipation, diarrhea, heartburn and melena.  Genitourinary: Negative.   Skin: Positive for rash.  Neurological: Negative for dizziness, sensory change, loss of consciousness and headaches.  Psychiatric/Behavioral: Negative for depression. The patient is not nervous/anxious and does not have insomnia.      Objective:     Today's Vitals   01/04/19 1557  BP: 124/80  Pulse: 68  Temp: (!) 97.5 F (36.4 C)  SpO2: 99%  Weight: 181 lb 12.8 oz (82.5 kg)   Body mass index is 31.7 kg/m.  General appearance: alert, no distress, WD/WN, female HEENT: ecchymosis left eye lid from rolling out of bed normocephalic, sclerae anicteric, TMs pearly, nares patent, no discharge or erythema, pharynx normal Oral cavity: MMM, no  lesions Neck: supple, no lymphadenopathy, no thyromegaly, no masses Heart: RRR, normal S1, S2, no murmurs Lungs: CTA bilaterally, no wheezes, rhonchi, or rales Abdomen: +bs, soft, non tender, non distended, no masses, no hepatomegaly, no splenomegaly Musculoskeletal: nontender, no swelling, no obvious deformity Extremities: no edema, no cyanosis, no clubbing, right great toenail thick, brittle, no warmth, erythema Pulses: 2+ symmetric, upper and lower extremities, normal cap refill Skin: warm/dry; she has patchy scattered rash to bilateral arms; erythematous base with plaque like appearance. R great toe nail is thickened and yellow Neurological: alert, oriented x 3, CN2-12 intact, strength normal upper extremities and lower extremities, sensation normal throughout, DTRs 2+ throughout, no cerebellar signs, gait normal Psychiatric: normal affect, behavior normal, pleasant     Izora Ribas, NP   01/04/2019

## 2019-01-04 ENCOUNTER — Encounter: Payer: Self-pay | Admitting: Adult Health

## 2019-01-04 ENCOUNTER — Ambulatory Visit (INDEPENDENT_AMBULATORY_CARE_PROVIDER_SITE_OTHER): Payer: Medicare Other | Admitting: Adult Health

## 2019-01-04 ENCOUNTER — Other Ambulatory Visit: Payer: Self-pay

## 2019-01-04 VITALS — BP 124/80 | HR 68 | Temp 97.5°F | Wt 181.8 lb

## 2019-01-04 DIAGNOSIS — I10 Essential (primary) hypertension: Secondary | ICD-10-CM

## 2019-01-04 DIAGNOSIS — F3341 Major depressive disorder, recurrent, in partial remission: Secondary | ICD-10-CM

## 2019-01-04 DIAGNOSIS — E559 Vitamin D deficiency, unspecified: Secondary | ICD-10-CM

## 2019-01-04 DIAGNOSIS — E782 Mixed hyperlipidemia: Secondary | ICD-10-CM | POA: Diagnosis not present

## 2019-01-04 DIAGNOSIS — R7309 Other abnormal glucose: Secondary | ICD-10-CM

## 2019-01-04 DIAGNOSIS — Z79899 Other long term (current) drug therapy: Secondary | ICD-10-CM

## 2019-01-04 DIAGNOSIS — Z6831 Body mass index (BMI) 31.0-31.9, adult: Secondary | ICD-10-CM

## 2019-01-04 DIAGNOSIS — E6609 Other obesity due to excess calories: Secondary | ICD-10-CM

## 2019-01-04 MED ORDER — TRIAMCINOLONE ACETONIDE 0.1 % EX OINT: 1.0000 "application " | TOPICAL_OINTMENT | Freq: Two times a day (BID) | CUTANEOUS | 1 refills | Status: DC

## 2019-01-04 NOTE — Patient Instructions (Addendum)
Goals    . Exercise 150 min/wk Moderate Activity    . Weight (lb) < 160 lb (72.6 kg)        Call your insurance to ask about shingrix - 2 part new shingles vaccine, much better than old zostavax but expensive   Can get at [facility] and some [facility] - call to ask first    Eczema Eczema is a broad term for a group of skin conditions that cause skin to become rough and inflamed. Each type of eczema has different triggers, symptoms, and treatments. Eczema of any type is usually itchy and symptoms range from mild to severe. Eczema and its symptoms are not spread from person to person (are not contagious). It can appear on different parts of the body at different times. Your eczema may not look the same as someone else's eczema. What are the types of eczema? Atopic dermatitis This is a long-term (chronic) skin disease that keeps coming back (recurring). Usual symptoms are dry skin and small, solid pimples that may swell and leak fluid (weep). Contact dermatitis  This happens when something irritates the skin and causes a rash. The irritation can come from substances that you are allergic to (allergens), such as poison ivy, chemicals, or medicines that were applied to your skin. Dyshidrotic eczema This is a form of eczema on the hands and feet. It shows up as very itchy, fluid-filled blisters. It can affect people of any age, but is more common before age 42. Hand eczema  This causes very itchy areas of skin on the palms and sides of the hands and fingers. This type of eczema is common in industrial jobs where you may be exposed to many different types of irritants. Lichen simplex chronicus This type of eczema occurs when a person constantly scratches one area of the body. Repeated scratching of the area leads to thickened skin (lichenification). Lichen simplex chronicus can occur along with other types of eczema. It is more common in adults, but may be seen in children as well. Nummular  eczema This is a common type of eczema. It has no known cause. It typically causes a red, circular, crusty lesion (plaque) that may be itchy. Scratching may become a habit and can cause bleeding. Nummular eczema occurs most often in people of middle-age or older. It most often affects the hands. Seborrheic dermatitis This is a common skin disease that mainly affects the scalp. It may also affect any oily areas of the body, such as the face, sides of nose, eyebrows, ears, eyelids, and chest. It is marked by small scaling and redness of the skin (erythema). This can affect people of all ages. In infants, this condition is known as Location manager." Stasis dermatitis This is a common skin disease that usually appears on the legs and feet. It most often occurs in people who have a condition that prevents blood from being pumped through the veins in the legs (chronic venous insufficiency). Stasis dermatitis is a chronic condition that needs long-term management. How is eczema diagnosed? Your health care provider will examine your skin and review your medical history. He or she may also give you skin patch tests. These tests involve taking patches that contain possible allergens and placing them on your back. He or she will then check in a few days to see if an allergic reaction occurred. What are the common treatments? Treatment for eczema is based on the type of eczema you have. Hydrocortisone steroid medicine can relieve itching quickly  and help reduce inflammation. This medicine may be prescribed or obtained over-the-counter, depending on the strength of the medicine that is needed. Follow these instructions at home:  Take over-the-counter and prescription medicines only as told by your health care provider.  Use creams or ointments to moisturize your skin. Do not use lotions.  Learn what triggers or irritates your symptoms. Avoid these things.  Treat symptom flare-ups quickly.  Do not itch your skin.  This can make your rash worse.  Keep all follow-up visits as told by your health care provider. This is important. Where to find more information  The American Academy of Dermatology: http://jones-macias.info/  The National Eczema Association: www.nationaleczema.org Contact a health care provider if:  You have serious itching, even with treatment.  You regularly scratch your skin until it bleeds.  Your rash looks different than usual.  Your skin is painful, swollen, or more red than usual.  You have a fever. Summary  There are eight general types of eczema. Each type has different triggers.  Eczema of any type causes itching that may range from mild to severe.  Treatment varies based on the type of eczema you have. Hydrocortisone steroid medicine can help with itching and inflammation.  Protecting your skin is the best way to prevent eczema. Use moisturizers and lotions. Avoid triggers and irritants, and treat flare-ups quickly. This information is not intended to replace advice given to you by your health care provider. Make sure you discuss any questions you have with your health care provider. Document Released: 07/16/2016 Document Revised: 02/12/2017 Document Reviewed: 07/16/2016 Elsevier Patient Education  2020 [employer].

## 2019-01-05 ENCOUNTER — Other Ambulatory Visit: Payer: Self-pay | Admitting: Adult Health

## 2019-01-05 ENCOUNTER — Ambulatory Visit: Payer: PPO | Admitting: Adult Health

## 2019-01-05 LAB — LIPID PANEL
Cholesterol: 162 mg/dL (ref <200)
HDL: 40 mg/dL — ABNORMAL LOW (ref >=50)
LDL Cholesterol (Calc): 75 mg/dL (calc)
Non-HDL Cholesterol (Calc): 122 mg/dL (calc) (ref <130)
Total CHOL/HDL Ratio: 4.1 (calc) (ref <5.0)
Triglycerides: 393 mg/dL — ABNORMAL HIGH (ref <150)

## 2019-01-05 LAB — CBC WITH DIFFERENTIAL/PLATELET
Absolute Monocytes: 747 cells/uL (ref 200–950)
Basophils Absolute: 59 cells/uL (ref 0–200)
Basophils Relative: 0.8 %
Eosinophils Absolute: 237 cells/uL (ref 15–500)
Eosinophils Relative: 3.2 %
HCT: 42.9 % (ref 35.0–45.0)
Hemoglobin: 14.4 g/dL (ref 11.7–15.5)
Lymphs Abs: 2331 cells/uL (ref 850–3900)
MCH: 32.8 pg (ref 27.0–33.0)
MCHC: 33.6 g/dL (ref 32.0–36.0)
MCV: 97.7 fL (ref 80.0–100.0)
MPV: 10 fL (ref 7.5–12.5)
Monocytes Relative: 10.1 %
Neutro Abs: 4026 cells/uL (ref 1500–7800)
Neutrophils Relative %: 54.4 %
Platelets: 327 10*3/uL (ref 140–400)
RBC: 4.39 10*6/uL (ref 3.80–5.10)
RDW: 12.7 % (ref 11.0–15.0)
Total Lymphocyte: 31.5 %
WBC: 7.4 10*3/uL (ref 3.8–10.8)

## 2019-01-05 LAB — COMPLETE METABOLIC PANEL WITH GFR
AG Ratio: 1.4 (calc) (ref 1.0–2.5)
ALT: 15 U/L (ref 6–29)
AST: 24 U/L (ref 10–35)
Albumin: 4 g/dL (ref 3.6–5.1)
Alkaline phosphatase (APISO): 66 U/L (ref 37–153)
BUN: 10 mg/dL (ref 7–25)
CO2: 26 mmol/L (ref 20–32)
Calcium: 9.4 mg/dL (ref 8.6–10.4)
Chloride: 103 mmol/L (ref 98–110)
Creat: 0.73 mg/dL (ref 0.60–0.93)
GFR, Est African American: 95 mL/min/{1.73_m2} (ref >=60)
GFR, Est Non African American: 82 mL/min/{1.73_m2} (ref >=60)
Globulin: 2.9 g/dL (calc) (ref 1.9–3.7)
Glucose, Bld: 86 mg/dL (ref 65–99)
Potassium: 3.7 mmol/L (ref 3.5–5.3)
Sodium: 140 mmol/L (ref 135–146)
Total Bilirubin: 0.6 mg/dL (ref 0.2–1.2)
Total Protein: 6.9 g/dL (ref 6.1–8.1)

## 2019-01-05 LAB — HEMOGLOBIN A1C
Hgb A1c MFr Bld: 5.9 % of total Hgb — ABNORMAL HIGH (ref <5.7)
Mean Plasma Glucose: 123 (calc)
eAG (mmol/L): 6.8 (calc)

## 2019-01-05 LAB — MAGNESIUM: Magnesium: 2 mg/dL (ref 1.5–2.5)

## 2019-01-05 LAB — TSH: TSH: 2.37 mIU/L (ref 0.40–4.50)

## 2019-01-05 LAB — VITAMIN D 25 HYDROXY (VIT D DEFICIENCY, FRACTURES): Vit D, 25-Hydroxy: 43 ng/mL (ref 30–100)

## 2019-02-21 ENCOUNTER — Other Ambulatory Visit: Payer: Self-pay | Admitting: Internal Medicine

## 2019-04-05 ENCOUNTER — Other Ambulatory Visit: Payer: Self-pay | Admitting: Internal Medicine

## 2019-04-20 ENCOUNTER — Other Ambulatory Visit: Payer: Self-pay | Admitting: Physician Assistant

## 2019-04-20 DIAGNOSIS — F411 Generalized anxiety disorder: Secondary | ICD-10-CM

## 2019-04-27 DIAGNOSIS — M25551 Pain in right hip: Secondary | ICD-10-CM | POA: Diagnosis not present

## 2019-05-26 DIAGNOSIS — M25551 Pain in right hip: Secondary | ICD-10-CM | POA: Diagnosis not present

## 2019-05-30 ENCOUNTER — Encounter: Payer: Self-pay | Admitting: Physician Assistant

## 2019-05-31 DIAGNOSIS — M25551 Pain in right hip: Secondary | ICD-10-CM | POA: Diagnosis not present

## 2019-05-31 DIAGNOSIS — M1611 Unilateral primary osteoarthritis, right hip: Secondary | ICD-10-CM | POA: Diagnosis not present

## 2019-06-14 NOTE — Progress Notes (Deleted)
WELLNESS and follow up  Assessment:   Essential hypertension - continue medications, DASH diet, exercise and monitor at home. Call if greater than 130/80.  -     CBC with Differential/Platelet -     BASIC METABOLIC PANEL WITH GFR -     Hepatic function panel -     TSH  Mixed hyperlipidemia -continue medications, check lipids, decrease fatty foods, increase activity.  -     Lipid panel  Prediabetes Discussed general issues about diabetes pathophysiology and management., Educational material distributed., Suggested low cholesterol diet., Encouraged aerobic exercise., Discussed foot care., Reminded to get yearly retinal exam. -     Hemoglobin A1c  Vitamin D deficiency Continue supplement  Medication management -     Magnesium  Recurrent major depressive disorder, in partial remission ([place]) - continue medications, stress management techniques discussed, increase water, good sleep hygiene discussed, increase exercise, and increase veggies.   Recurrent UTI monitor  Class 1 obesity due to excess calories with serious comorbidity and body mass index (BMI) of 31.0 to 31.9 in adult - long discussion about weight loss, diet, and exercise  Ocular herpes zoster Continue follow up eye doctor   Over 30 minutes of exam, counseling, chart review and critical decision making was performed Future Appointments  Date Time Provider [place]  06/15/2019  2:00 PM Vicie Mutters, PA-C [facility] None  06/24/2020  2:00 PM Vicie Mutters, PA-C [facility] None     Subjective:  Allison Barker is a 74 y.o. female who presents for Wellness and follow up.    Her blood pressure has been controlled at home, today their BP is   She does not workout. She denies chest pain, shortness of breath, dizziness.  She is on cholesterol medication and denies myalgias. Her cholesterol is at goal. The cholesterol last visit was:   Lab Results  Component Value Date   CHOL 162 01/04/2019   HDL 40 (L)  01/04/2019   LDLCALC 75 01/04/2019   TRIG 393 (H) 01/04/2019   CHOLHDL 4.1 01/04/2019   Last A1C normal range, but has been in the preDM range in the past.  Lab Results  Component Value Date   HGBA1C 5.9 (H) 01/04/2019   Last GFR: Lab Results  Component Value Date   [facility] 82 01/04/2019   Patient is on Vitamin D supplement.   Lab Results  Component Value Date   VD25OH 43 01/04/2019     BMI is There is no height or weight on file to calculate BMI., she is working on diet and exercise. Wt Readings from Last 3 Encounters:  01/04/19 181 lb 12.8 oz (82.5 kg)  05/24/18 181 lb 6.4 oz (82.3 kg)  02/09/18 184 lb 9.6 oz (83.7 kg)   She has ocular herpes, on acyclovir for this and follows Dr. Patrice Paradise every 6 months.    Medication Review:  Current Outpatient Medications (Endocrine & Metabolic):  .  alendronate (FOSAMAX) 70 MG tablet, Take 1 tablet (70 mg total) by mouth once a week. Take with a full glass of water on an empty stomach.  Current Outpatient Medications (Cardiovascular):  .  bisoprolol-hydrochlorothiazide (ZIAC) 5-6.25 MG tablet, TAKE ONE TABLET BY MOUTH EVERY DAY FOR BLOOD PRESSURE .  furosemide (LASIX) 40 MG tablet, Take 1 tablet Daily for BP / Fluid Retention / Ankle Swelling .  pravastatin (PRAVACHOL) 40 MG tablet, Take 1 tablet at Bedtime for Cholesterol  Current Outpatient Medications (Respiratory):  .  cetirizine (ZYRTEC) 10 MG tablet, Take 10 mg  by mouth daily.  Current Outpatient Medications (Analgesics):  .  aspirin 81 MG tablet, Take 81 mg by mouth daily.    Current Outpatient Medications (Other):  .  acyclovir (ZOVIRAX) 400 MG tablet, Take 400 mg by mouth 3 (three) times daily.  Marland Kitchen  ALPRAZolam (XANAX) 0.5 MG tablet, TAKE ONE-HALF TO ONE TABLET BY MOUTH AT BEDTIME ONLY IF NEEDED FOR INSOMNIA. AVOID TAKING DAILY DUE TO RISK OF ADDICTION. Marland Kitchen  Cholecalciferol (VITAMIN D PO), Take 2,000 Units by mouth 3 (three) times daily. .  Coenzyme Q10 (COQ10 PO), Take 1  capsule by mouth daily. Marland Kitchen  escitalopram (LEXAPRO) 20 MG tablet, Take 1 tablet Daily for Mood .  Multiple Vitamins-Minerals (MULTIVITAMIN PO), Take by mouth 2 (two) times daily. Marland Kitchen  OVER THE COUNTER MEDICATION, Calcium 2 tabs per day .  OVER THE COUNTER MEDICATION, 1 capsule daily. Tumeric .  prednisoLONE acetate (PRED FORTE) 1 % ophthalmic suspension,  .  triamcinolone ointment (KENALOG) 0.1 %, Apply 1 application topically 2 (two) times daily. .  valACYclovir (VALTREX) 500 MG tablet, Take 500 mg by mouth 3 (three) times daily.   Allergies  Allergen Reactions  . Ppd [Tuberculin Purified Protein Derivative]     Current Problems (verified) Patient Active Problem List   Diagnosis Date Noted  . Osteoporosis 04/11/2018  . Major depression, recurrent, in partial remission  (HCC) 01/28/2015  . Medication management 10/03/2014  . Obesity 03/21/2014  . Recurrent UTI 03/21/2014  . Hypertension   . Hyperlipidemia   . Vitamin D deficiency   . Abnormal glucose     Screening Tests Immunization History  Administered Date(s) Administered  . DT (Pediatric) 01/08/2015  . Hepatitis B 03/16/2001, 09/22/2001  . Influenza, High Dose Seasonal PF 11/13/2013, 01/08/2015, 12/25/2015, 01/13/2017, 12/03/2017, 12/01/2018  . Influenza,inj,quad, With Preservative 12/14/2017  . Pneumococcal Conjugate-13 04/12/2014, 07/23/2016  . Pneumococcal Polysaccharide-23 12/25/2010  . Td 06/17/2004  . Zoster 07/08/2010   Preventative care: Tetanus: 01/08/15 Pneumovax:12/25/2010 [facility] Flu vaccine: 2019 Zostavax:07/08/10  MGM: 1/20203D DEXA: 03/2018 left femoral neck T -2.9 on fosamax x 03/2018 Colonoscopy: 10/06/13 due 8 years CXR 2013  Names of Other Physician/Practitioners you currently use: 1. [facility] here for primary care 2. Cresenciano Genre, eye doctor, last visit 12/2015 has OV Patient Care Team: Lucky Cowboy, MD as PCP - General (Internal  Medicine) Vida Rigger, MD as Consulting Physician (Gastroenterology) Elpidio Galea, MD as Consulting Physician (Ophthalmology) Sallye Lat, MD as Consulting Physician (Optometry) Arminda Resides, MD as Consulting Physician (Dermatology)  SURGICAL HISTORY She  has a past surgical history that includes Tonsillectomy and adenoidectomy and Eye surgery (Left, 11/2012). FAMILY HISTORY Her family history includes AAA (abdominal aortic aneurysm) in her mother; Diabetes in her father; Heart disease in her mother; Hyperlipidemia in her mother; Hypertension in her father. SOCIAL HISTORY She  reports that she has never smoked. She has never used smokeless tobacco.  MEDICARE WELLNESS OBJECTIVES: Physical activity:   Cardiac risk factors:   Depression/mood screen:   Depression screen [facility] 2/9 05/24/2018  Decreased Interest 0  Down, Depressed, Hopeless 0  PHQ - 2 Score 0  Altered sleeping -  Tired, decreased energy -  Change in appetite -  Feeling bad or failure about yourself  -  Trouble concentrating -  Moving slowly or fidgety/restless -  Suicidal thoughts -  PHQ-9 Score -  Difficult doing work/chores -    ADLs:  No flowsheet data found.  Cognitive Testing  Alert? Yes  Normal Appearance?Yes  Oriented to person? Yes  Place? Yes   Time? Yes  Recall of three objects?  Yes  Can perform simple calculations? Yes  Displays appropriate judgment?Yes  Can read the correct time from a watch face?Yes  EOL planning:      Review of Systems  Constitutional: Negative for chills, fever and malaise/fatigue.  HENT: Negative for congestion, ear pain and sore throat.   Eyes: Negative.   Respiratory: Negative for cough, shortness of breath and wheezing.   Cardiovascular: Negative for chest pain, palpitations and leg swelling.  Gastrointestinal: Negative for abdominal pain, blood in stool, constipation, diarrhea, heartburn and melena.  Genitourinary: Negative.   Skin: Negative.   Neurological:  Negative for dizziness, sensory change, loss of consciousness and headaches.  Psychiatric/Behavioral: Negative for depression. The patient is not nervous/anxious and does not have insomnia.      Objective:     There were no vitals filed for this visit. There is no height or weight on file to calculate BMI.  General appearance: alert, no distress, WD/WN, female HEENT: ecchymosis left eye lid from rolling out of bed normocephalic, sclerae anicteric, TMs pearly, nares patent, no discharge or erythema, pharynx normal Oral cavity: MMM, no lesions Neck: supple, no lymphadenopathy, no thyromegaly, no masses Heart: RRR, normal S1, S2, no murmurs Lungs: CTA bilaterally, no wheezes, rhonchi, or rales Abdomen: +bs, soft, non tender, non distended, no masses, no hepatomegaly, no splenomegaly Musculoskeletal: nontender, no swelling, no obvious deformity Extremities: no edema, no cyanosis, no clubbing, right great toenail thick, brittle, no warmth, erythema Pulses: 2+ symmetric, upper and lower extremities, normal cap refill Neurological: alert, oriented x 3, CN2-12 intact, strength normal upper extremities and lower extremities, sensation normal throughout, DTRs 2+ throughout, no cerebellar signs, gait normal Psychiatric: normal affect, behavior normal, pleasant    Medicare Attestation I have personally reviewed: The patient's medical and social history Their use of alcohol, tobacco or illicit drugs Their current medications and supplements The patient's functional ability including ADLs,fall risks, home safety risks, cognitive, and hearing and visual impairment Diet and physical activities Evidence for depression or mood disorders  The patient's weight, height, BMI, and visual acuity have been recorded in the chart.  I have made referrals, counseling, and provided education to the patient based on review of the above and I have provided the patient with a written personalized care plan for  preventive services.      Quentin Mulling, PA-C   06/14/2019

## 2019-06-15 ENCOUNTER — Encounter: Payer: Self-pay | Admitting: Physician Assistant

## 2019-06-28 DIAGNOSIS — M1611 Unilateral primary osteoarthritis, right hip: Secondary | ICD-10-CM | POA: Diagnosis not present

## 2019-07-13 ENCOUNTER — Encounter: Payer: Self-pay | Admitting: Physician Assistant

## 2019-07-13 ENCOUNTER — Ambulatory Visit (INDEPENDENT_AMBULATORY_CARE_PROVIDER_SITE_OTHER): Payer: Medicare Other | Admitting: Physician Assistant

## 2019-07-13 ENCOUNTER — Other Ambulatory Visit: Payer: Self-pay

## 2019-07-13 VITALS — BP 124/78 | HR 89 | Temp 97.5°F | Ht 63.0 in | Wt 175.0 lb

## 2019-07-13 DIAGNOSIS — F411 Generalized anxiety disorder: Secondary | ICD-10-CM

## 2019-07-13 DIAGNOSIS — Z6831 Body mass index (BMI) 31.0-31.9, adult: Secondary | ICD-10-CM

## 2019-07-13 DIAGNOSIS — E6609 Other obesity due to excess calories: Secondary | ICD-10-CM

## 2019-07-13 DIAGNOSIS — Z0001 Encounter for general adult medical examination with abnormal findings: Secondary | ICD-10-CM | POA: Diagnosis not present

## 2019-07-13 DIAGNOSIS — F3341 Major depressive disorder, recurrent, in partial remission: Secondary | ICD-10-CM | POA: Diagnosis not present

## 2019-07-13 DIAGNOSIS — Z0000 Encounter for general adult medical examination without abnormal findings: Secondary | ICD-10-CM

## 2019-07-13 DIAGNOSIS — Z79899 Other long term (current) drug therapy: Secondary | ICD-10-CM | POA: Diagnosis not present

## 2019-07-13 DIAGNOSIS — E559 Vitamin D deficiency, unspecified: Secondary | ICD-10-CM | POA: Diagnosis not present

## 2019-07-13 DIAGNOSIS — E782 Mixed hyperlipidemia: Secondary | ICD-10-CM

## 2019-07-13 DIAGNOSIS — N390 Urinary tract infection, site not specified: Secondary | ICD-10-CM

## 2019-07-13 DIAGNOSIS — R6889 Other general symptoms and signs: Secondary | ICD-10-CM | POA: Diagnosis not present

## 2019-07-13 DIAGNOSIS — I10 Essential (primary) hypertension: Secondary | ICD-10-CM

## 2019-07-13 DIAGNOSIS — M810 Age-related osteoporosis without current pathological fracture: Secondary | ICD-10-CM

## 2019-07-13 DIAGNOSIS — R7309 Other abnormal glucose: Secondary | ICD-10-CM | POA: Diagnosis not present

## 2019-07-13 MED ORDER — ALPRAZOLAM 0.5 MG PO TABS: ORAL_TABLET | ORAL | 0 refills | Status: DC

## 2019-07-13 MED ORDER — ACYCLOVIR 400 MG PO TABS: 400.0000 mg | ORAL_TABLET | Freq: Three times a day (TID) | ORAL | 2 refills | Status: DC

## 2019-07-13 MED ORDER — ALENDRONATE SODIUM 70 MG PO TABS: 70.0000 mg | ORAL_TABLET | ORAL | 3 refills | Status: DC

## 2019-07-13 NOTE — Progress Notes (Signed)
WELLNESS and follow up  Assessment:   Essential hypertension - continue medications, DASH diet, exercise and monitor at home. Call if greater than 130/80.  -     CBC with Differential/Platelet -     BASIC METABOLIC PANEL WITH GFR -     Hepatic function panel -     TSH  Mixed hyperlipidemia -continue medications, check lipids, decrease fatty foods, increase activity.  -     Lipid panel  Prediabetes Discussed general issues about diabetes pathophysiology and management., Educational material distributed., Suggested low cholesterol diet., Encouraged aerobic exercise., Discussed foot care., Reminded to get yearly retinal exam. -     Hemoglobin A1c  Vitamin D deficiency Continue supplement  Medication management -     Magnesium  Recurrent major depressive disorder, in partial remission (HCC) - continue medications, stress management techniques discussed, increase water, good sleep hygiene discussed, increase exercise, and increase veggies.   Recurrent UTI monitor  Class 1 obesity due to excess calories with serious comorbidity and body mass index (BMI) of 31.0 to 31.9 in adult - long discussion about weight loss, diet, and exercise  Ocular herpes zoster Continue follow up eye doctor  Osteoporosis, unspecified osteoporosis type, unspecified pathological fracture presence -     alendronate (FOSAMAX) 70 MG tablet; Take 1 tablet (70 mg total) by mouth once a week. Try to get back on it, if she has muscle aches will stop and try to refer for prolia   Over 30 minutes of exam, counseling, chart review and critical decision making was performed Future Appointments  Date Time Provider Department Center  01/11/2020  2:30 PM Quentin Mulling, PA-C [facility] None  06/24/2020  2:00 PM Quentin Mulling, PA-C [facility] None     Subjective:  Allison Barker is a 74 y.o. female who presents for Wellness and follow up.   [place]  She has been seeing ortho for right  hip pain, will have sharp pain with rotation to the right of her hip. [facility], has had injections and going to go see Dr. Charlann Boxer.    Her blood pressure has been controlled at home, today their BP is BP: 124/78   BMI is Body mass index is 31 kg/m., she is working on diet and exercise. Wt Readings from Last 3 Encounters:  07/13/19 175 lb (79.4 kg)  01/04/19 181 lb 12.8 oz (82.5 kg)  05/24/18 181 lb 6.4 oz (82.3 kg)    She does not workout. She denies chest pain, shortness of breath, dizziness.  She is on cholesterol medication and denies myalgias. Her cholesterol is at goal. The cholesterol last visit was:   Lab Results  Component Value Date   CHOL 172 07/13/2019   HDL 54 07/13/2019   LDLCALC 82 07/13/2019   TRIG 276 (H) 07/13/2019   CHOLHDL 3.2 07/13/2019   Last O6V normal range, but has been in the preDM range in the past.  Lab Results  Component Value Date   HGBA1C 6.0 (H) 07/13/2019   Last GFR: Lab Results  Component Value Date   GFRNONAA 71 07/13/2019   Patient is on Vitamin D supplement.   Lab Results  Component Value Date   VD25OH 83 07/13/2019     She has ocular herpes, on acyclovir for this and follows Dr. Noel Gerold every 6 months.    Medication Review:  Current Outpatient Medications (Endocrine & Metabolic):  .  alendronate (FOSAMAX) 70 MG tablet, Take 1 tablet (70 mg  total) by mouth once a week.  Current Outpatient Medications (Cardiovascular):  .  bisoprolol-hydrochlorothiazide (ZIAC) 5-6.25 MG tablet, TAKE ONE TABLET BY MOUTH EVERY DAY FOR BLOOD PRESSURE .  furosemide (LASIX) 40 MG tablet, Take 1 tablet Daily for BP / Fluid Retention / Ankle Swelling .  pravastatin (PRAVACHOL) 40 MG tablet, Take 1 tablet at Bedtime for Cholesterol  Current Outpatient Medications (Respiratory):  .  cetirizine (ZYRTEC) 10 MG tablet, Take 10 mg by mouth daily.  Current Outpatient Medications (Analgesics):  .  aspirin 81 MG tablet, Take 81 mg by mouth  daily.    Current Outpatient Medications (Other):  .  acyclovir (ZOVIRAX) 400 MG tablet, Take 1 tablet (400 mg total) by mouth 3 (three) times daily. Marland Kitchen  ALPRAZolam (XANAX) 0.5 MG tablet, TAKE ONE-HALF TO ONE TABLET BY MOUTH AT BEDTIME ONLY IF NEEDED FOR INSOMNIA. AVOID TAKING DAILY DUE TO RISK OF ADDICTION. Marland Kitchen  Cholecalciferol (VITAMIN D PO), Take 2,000 Units by mouth 3 (three) times daily. .  Coenzyme Q10 (COQ10 PO), Take 1 capsule by mouth daily. Marland Kitchen  escitalopram (LEXAPRO) 20 MG tablet, Take 1 tablet Daily for Mood .  Multiple Vitamins-Minerals (MULTIVITAMIN PO), Take by mouth 2 (two) times daily. Marland Kitchen  OVER THE COUNTER MEDICATION, Calcium 2 tabs per day .  OVER THE COUNTER MEDICATION, 1 capsule daily. Tumeric .  prednisoLONE acetate (PRED FORTE) 1 % ophthalmic suspension,  .  triamcinolone ointment (KENALOG) 0.1 %, Apply 1 application topically 2 (two) times daily. .  valACYclovir (VALTREX) 500 MG tablet, Take 500 mg by mouth 3 (three) times daily.   Allergies  Allergen Reactions  . Ppd [Tuberculin Purified Protein Derivative]     Current Problems (verified) Patient Active Problem List   Diagnosis Date Noted  . Osteoporosis 04/11/2018  . Major depression, recurrent, in partial remission  (HCC) 01/28/2015  . Medication management 10/03/2014  . Obesity 03/21/2014  . Recurrent UTI 03/21/2014  . Hypertension   . Hyperlipidemia   . Vitamin D deficiency   . Abnormal glucose     Screening Tests Immunization History  Administered Date(s) Administered  . DT (Pediatric) 01/08/2015  . Hepatitis B 03/16/2001, 09/22/2001  . Influenza, High Dose Seasonal PF 11/13/2013, 01/08/2015, 12/25/2015, 01/13/2017, 12/03/2017, 12/01/2018  . Influenza,inj,quad, With Preservative 12/14/2017  . Pneumococcal Conjugate-13 04/12/2014, 07/23/2016  . Pneumococcal Polysaccharide-23 12/25/2010  . Td 06/17/2004  . Zoster 07/08/2010   Preventative care: Tetanus: 01/08/15 Pneumovax:12/25/2010 Prevnar 13:  2014 at [facility] Flu vaccine: 2019 Zostavax:07/08/10 Shingles completed Covid did not get, does not want, given information  MGM: 1/20203D has appointment DEXA: 03/2018 left femoral neck T -2.9 on fosamax x 03/2018- getting back on it Colonoscopy: 10/06/13 due 8 years CXR 2013  Names of Other Physician/Practitioners you currently use: 1. [facility] here for primary care 2. Cresenciano Genre, eye doctor, last visit 12/2018  Patient Care Team: Lucky Cowboy, MD as PCP - General (Internal Medicine) Vida Rigger, MD as Consulting Physician (Gastroenterology) Elpidio Galea, MD as Consulting Physician (Ophthalmology) Sallye Lat, MD as Consulting Physician (Optometry) Arminda Resides, MD as Consulting Physician (Dermatology)  SURGICAL HISTORY She  has a past surgical history that includes Tonsillectomy and adenoidectomy and Eye surgery (Left, 11/2012). FAMILY HISTORY Her family history includes AAA (abdominal aortic aneurysm) in her mother; Diabetes in her father; Heart disease in her mother; Hyperlipidemia in her mother; Hypertension in her father. SOCIAL HISTORY She  reports that she has never smoked. She has never used smokeless tobacco.  MEDICARE  WELLNESS OBJECTIVES: Physical activity: Current Exercise Habits: The patient does not participate in regular exercise at present, Exercise limited by: orthopedic condition(s) Cardiac risk factors: Cardiac Risk Factors include: advanced age (>44men, >38 women);dyslipidemia;hypertension;obesity (BMI >30kg/m2);sedentary lifestyle Depression/mood screen:   Depression screen [facility] 2/9 07/14/2019  Decreased Interest 0  Down, Depressed, Hopeless 0  PHQ - 2 Score 0  Altered sleeping 0  Tired, decreased energy 0  Change in appetite 0  Feeling bad or failure about yourself  0  Trouble concentrating 0  Moving slowly or fidgety/restless 0  Suicidal thoughts 0  PHQ-9 Score 0  Difficult doing work/chores Not  difficult at all    ADLs:  In your present state of health, do you have any difficulty performing the following activities: 07/14/2019  Hearing? N  Vision? N  Difficulty concentrating or making decisions? N  Walking or climbing stairs? N  Comment can be painful with her right hip  Dressing or bathing? N  Doing errands, shopping? N  Some recent data might be hidden    Cognitive Testing  Alert? Yes  Normal Appearance?Yes  Oriented to person? Yes  Place? Yes   Time? Yes  Recall of three objects?  Yes  Can perform simple calculations? Yes  Displays appropriate judgment?Yes  Can read the correct time from a watch face?Yes  EOL planning: Does Patient Have a Medical Advance Directive?: Yes Does patient want to make changes to medical advance directive?: No - Patient declined    Review of Systems  Constitutional: Negative for chills, fever and malaise/fatigue.  HENT: Negative for congestion, ear pain and sore throat.   Eyes: Negative.   Respiratory: Negative for cough, shortness of breath and wheezing.   Cardiovascular: Negative for chest pain, palpitations and leg swelling.  Gastrointestinal: Negative for abdominal pain, blood in stool, constipation, diarrhea, heartburn and melena.  Genitourinary: Negative.   Musculoskeletal: Positive for joint pain.  Skin: Negative.   Neurological: Negative for dizziness, sensory change, loss of consciousness and headaches.  Psychiatric/Behavioral: Negative for depression. The patient is not nervous/anxious and does not have insomnia.      Objective:     Today's Vitals   07/13/19 1504  BP: 124/78  Pulse: 89  Temp: (!) 97.5 F (36.4 C)  SpO2: 97%  Weight: 175 lb (79.4 kg)  Height: 5\' 3"  (1.6 m)   Body mass index is 31 kg/m.  General appearance: alert, no distress, WD/WN, female HEENT: ecchymosis left eye lid from rolling out of bed normocephalic, sclerae anicteric, TMs pearly, nares patent, no discharge or erythema, pharynx  normal Oral cavity: MMM, no lesions Neck: supple, no lymphadenopathy, no thyromegaly, no masses Heart: RRR, normal S1, S2, no murmurs Lungs: CTA bilaterally, no wheezes, rhonchi, or rales Abdomen: +bs, soft, non tender, non distended, no masses, no hepatomegaly, no splenomegaly Musculoskeletal: nontender, no swelling, no obvious deformity Extremities: no edema, no cyanosis, no clubbing, right great toenail thick- has removed before and put on lamisil without, brittle, no warmth, erythema Pulses: 2+ symmetric, upper and lower extremities, normal cap refill Neurological: alert, oriented x 3, CN2-12 intact, strength normal upper extremities and lower extremities, sensation normal throughout, DTRs 2+ throughout, no cerebellar signs, gait normal Psychiatric: normal affect, behavior normal, pleasant    Medicare Attestation I have personally reviewed: The patient's medical and social history Their use of alcohol, tobacco or illicit drugs Their current medications and supplements The patient's functional ability including ADLs,fall risks, home safety risks, cognitive, and hearing and visual impairment Diet and physical  activities Evidence for depression or mood disorders  The patient's weight, height, BMI, and visual acuity have been recorded in the chart.  I have made referrals, counseling, and provided education to the patient based on review of the above and I have provided the patient with a written personalized care plan for preventive services.      Vicie Mutters, PA-C   07/14/2019

## 2019-07-13 NOTE — Patient Instructions (Addendum)
Get back on the fosamax If you have jaw pain or worsening pain then stop it and we can try to get you on prolia  Your bone density shows that you have osteoporosis. Osteoporosis is the weakening of your bones and this puts you at much greater risk for fracture with a fall. I'm going to send a medication called Fosamax for you to take once weekly, here is some information below. If you have any questions you can wait until your next OV and we can discuss in more detail in the office:  Breaking a bone can be serious, especially if the bone is in the hip. People who break a hip sometimes lose the ability to walk on their own. Many of them end up in a nursing home. That's why it is so important to avoid breaking a bone in the first place.   What can I do to keep my bones as healthy as possible? - You can: -Eat foods with a lot of calcium, such as milk, yogurt, and green leafy vegetables  -Eat foods with a lot of vitamin D, such as milk that has vitamin D added, and fish from the ocean  -Take calcium and vitamin D pills (if you do not get enough from the food that you eat) -Be active for at least 30 minutes, most days of the week -Avoid smoking  -Limit the amount of alcohol you drink to 1 to 2 drinks a day at most  What do osteoporosis medicines do? - If you have osteoporosis or a high risk of breaking a bone, the medicines your doctor prescribes can: -Reduce bone loss  -Increase bone density or keep it about the same -Reduce the chances that you will break a bone   Bisphosphonates - Most people being treated for osteoporosis take these medicines first. If they do not work well enough or cause side effects that are too hard to handle, there are other options.   Most people take one pill every week. If your doctor prescribes a bisphosphonate pill, you must take the medicine exactly as directed. If you don't, the medicine can irritate your throat or stomach. For most bisphosphonate pills, you must:   -Take the pill first thing in the morning, before you have anything to eat or drink.  -Drink an 8-ounce glass of water with the pill, but not eat or drink anything else for 30 minutes or 1 hour (depending on which pill you take).  -Avoid lying down for 30 minutes after taking the pill. (You must sit or stand during that time).     Here is some information about the vaccines. The data is very good and this information hopefully answers a lot of your questions and give you a confidence boost.   The Pfizer and Moderna vaccines are messenger RNA vaccines. That technology is not new, it has been studied for 20 years at least, used for cancer and MS treatment. They had started using the vaccine for MERS AND SARS (both a different coronavirus) 10 years ago but never finished so we had a good backbone for this vaccine.   There were no short cuts with the techniques for these clinical  trials, just lots of willing participants quickly and lots of up front money helped speed up the process.   The mRNA is very fragile which is why it needs to be kept so cold and thawed a certain way, think of it as a message in a glass bottle. NO PART of  the virus is in this vaccine, it is a clip of the genetic sequence. This mRNA is injected in your arm, connects with a ribosome, delivers the message and the degrades.  That is part of the cause of the sore arm, the mRNA never leaves your arm. It degrades there. The mRNA does not go into our nucleotide where our DNA is, and we would need a DNA reverse transcriptase to take RNA to DNA, we do not have this, it can not change our DNA. The ribosome that got the message creates a protein and that protein circulates in our body and we have an immune reaction to that creating antibodies. Any time our immune system is triggered, inflammation is triggered too so you can have a temp, muscle aches, etc. Normal reaction.  I have seen so many patients that just had mild COVID in the office  weeks later still have issues. We are still learning about post COVID syndrome, the CDC should be coming out for guidelines for practioners soon. There is too much unknown about COVID. We have been using vaccines for over 100 years or more, i [employer] technical [employer]. Ask your parents or any older friends about polio and that vaccine, that was a disease shutting down schools,had kids in iron lung, devastating young kids and families. People lined up for that vaccine and technology has only improved.   Please get the vaccine. If you have any further questions please make an appointment in the office to discuss further.  Marchelle Folks

## 2019-07-14 LAB — COMPLETE METABOLIC PANEL WITH GFR
AG Ratio: 1.4 (calc) (ref 1.0–2.5)
ALT: 26 U/L (ref 6–29)
AST: 30 U/L (ref 10–35)
Albumin: 4 g/dL (ref 3.6–5.1)
Alkaline phosphatase (APISO): 72 U/L (ref 37–153)
BUN: 15 mg/dL (ref 7–25)
CO2: 29 mmol/L (ref 20–32)
Calcium: 9.4 mg/dL (ref 8.6–10.4)
Chloride: 99 mmol/L (ref 98–110)
Creat: 0.82 mg/dL (ref 0.60–0.93)
GFR, Est African American: 82 mL/min/{1.73_m2} (ref >=60)
GFR, Est Non African American: 71 mL/min/{1.73_m2} (ref >=60)
Globulin: 2.8 g/dL (calc) (ref 1.9–3.7)
Glucose, Bld: 94 mg/dL (ref 65–99)
Potassium: 3.6 mmol/L (ref 3.5–5.3)
Sodium: 138 mmol/L (ref 135–146)
Total Bilirubin: 0.6 mg/dL (ref 0.2–1.2)
Total Protein: 6.8 g/dL (ref 6.1–8.1)

## 2019-07-14 LAB — VITAMIN D 25 HYDROXY (VIT D DEFICIENCY, FRACTURES): Vit D, 25-Hydroxy: 83 ng/mL (ref 30–100)

## 2019-07-14 LAB — TSH: TSH: 1.68 mIU/L (ref 0.40–4.50)

## 2019-07-14 LAB — CBC WITH DIFFERENTIAL/PLATELET
Absolute Monocytes: 908 cells/uL (ref 200–950)
Basophils Absolute: 71 cells/uL (ref 0–200)
Basophils Relative: 0.7 %
Eosinophils Absolute: 184 cells/uL (ref 15–500)
Eosinophils Relative: 1.8 %
HCT: 45.4 % — ABNORMAL HIGH (ref 35.0–45.0)
Hemoglobin: 15.4 g/dL (ref 11.7–15.5)
Lymphs Abs: 2581 cells/uL (ref 850–3900)
MCH: 33.5 pg — ABNORMAL HIGH (ref 27.0–33.0)
MCHC: 33.9 g/dL (ref 32.0–36.0)
MCV: 98.7 fL (ref 80.0–100.0)
MPV: 9.9 fL (ref 7.5–12.5)
Monocytes Relative: 8.9 %
Neutro Abs: 6457 cells/uL (ref 1500–7800)
Neutrophils Relative %: 63.3 %
Platelets: 281 10*3/uL (ref 140–400)
RBC: 4.6 10*6/uL (ref 3.80–5.10)
RDW: 12.4 % (ref 11.0–15.0)
Total Lymphocyte: 25.3 %
WBC: 10.2 10*3/uL (ref 3.8–10.8)

## 2019-07-14 LAB — HEMOGLOBIN A1C
Hgb A1c MFr Bld: 6 % of total Hgb — ABNORMAL HIGH (ref <5.7)
Mean Plasma Glucose: 126 (calc)
eAG (mmol/L): 7 (calc)

## 2019-07-14 LAB — LIPID PANEL
Cholesterol: 172 mg/dL (ref <200)
HDL: 54 mg/dL (ref >=50)
LDL Cholesterol (Calc): 82 mg/dL (calc)
Non-HDL Cholesterol (Calc): 118 mg/dL (calc) (ref <130)
Total CHOL/HDL Ratio: 3.2 (calc) (ref <5.0)
Triglycerides: 276 mg/dL — ABNORMAL HIGH (ref <150)

## 2019-07-14 LAB — MAGNESIUM: Magnesium: 2 mg/dL (ref 1.5–2.5)

## 2019-07-19 DIAGNOSIS — M25551 Pain in right hip: Secondary | ICD-10-CM | POA: Diagnosis not present

## 2019-07-19 DIAGNOSIS — M1611 Unilateral primary osteoarthritis, right hip: Secondary | ICD-10-CM | POA: Diagnosis not present

## 2019-08-02 ENCOUNTER — Other Ambulatory Visit: Payer: Self-pay | Admitting: Adult Health

## 2019-08-02 DIAGNOSIS — Z01818 Encounter for other preprocedural examination: Secondary | ICD-10-CM

## 2019-08-18 NOTE — Patient Instructions (Signed)
DUE TO COVID-19 ONLY ONE VISITOR IS ALLOWED TO COME WITH YOU AND STAY IN THE WAITING ROOM ONLY DURING PRE OP AND PROCEDURE DAY OF SURGERY. THE 2 VISITORS  MAY VISIT WITH YOU AFTER SURGERY IN YOUR PRIVATE ROOM DURING VISITING HOURS ONLY!  YOU NEED TO HAVE A COVID 19 TEST ON  6/18___ @_12 :20______, THIS TEST MUST BE DONE BEFORE SURGERY, COME  [address], [place] , .  [facility]) ONCE YOUR COVID TEST IS COMPLETED, PLEASE BEGIN THE QUARANTINE INSTRUCTIONS AS OUTLINED IN YOUR HANDOUT.                JEMINA SCAHILL    Your procedure is scheduled on: 09/05/19   Report to [facility] Main  Entrance   Report to admitting at  7:35 AM     Call this number if you have problems the morning of surgery [phone]   . BRUSH YOUR TEETH MORNING OF SURGERY AND RINSE YOUR MOUTH OUT, NO CHEWING GUM CANDY OR MINTS.    Do not eat food After Midnight.   YOU MAY HAVE CLEAR LIQUIDS FROM MIDNIGHT UNTIL 7:00 AM.   At 7:00 AM Please finish the prescribed Pre-Surgery Gatorade drink.   Nothing by mouth after you finish the Gatorade drink !   Take these medicines the morning of surgery with A SIP OF WATER: Lexapro, Zyrtec use your eye drops                                 You may not have any metal on your body including hair pins and              piercings  Do not wear jewelry, make-up, lotions, powders or perfumes, deodorant             Do not wear nail polish on your fingernails.  Do not shave  48 hours prior to surgery.              Do not bring valuables to the hospital. [facility] IS NOT             RESPONSIBLE   FOR VALUABLES.  Contacts, dentures or bridgework may not be worn into surgery. .     Patients discharged the day of surgery will not be allowed to drive home.   IF YOU ARE HAVING SURGERY AND GOING HOME THE SAME DAY, YOU MUST HAVE AN ADULT TO DRIVE YOU HOME AND BE WITH YOU FOR 24 HOURS.   YOU MAY GO HOME BY TAXI OR UBER OR ORTHERWISE, BUT AN  ADULT MUST ACCOMPANY YOU HOME AND STAY WITH YOU FOR 24 HOURS.  Name and phone number of your driver:  Special Instructions: N/A              Please read over the following fact sheets you were given: _____________________________________________________________________             [facility] - Preparing for Surgery Before surgery, you can play an important role.   Because skin is not sterile, your skin needs to be as free of germs as possible   You can reduce the number of germs on your skin by washing with CHG (chlorahexidine gluconate) soap before surgery.   CHG is an antiseptic cleaner which kills germs and bonds with the skin to continue killing germs even after washing. Please DO NOT use if you have an allergy  to CHG or antibacterial soaps .  If your skin becomes reddened/irritated stop using the CHG and inform your nurse when you arrive at Short Stay. Do not shave (including legs and underarms) for at least 48 hours prior to the first CHG shower.   Please follow these instructions carefully:  1.  Shower with CHG Soap the night before surgery and the  morning of Surgery.  2.  If you choose to wash your hair, wash your hair first as usual with your  normal  shampoo.  3.  After you shampoo, rinse your hair and body thoroughly to remove the  shampoo.                                        4.  Use CHG as you would any other liquid soap.  You can apply chg directly  to the skin and wash                       Gently with a scrungie or clean washcloth.  5.  Apply the CHG Soap to your body ONLY FROM THE NECK DOWN.   Do not use on face/ open                           Wound or open sores. Avoid contact with eyes, ears mouth and genitals (private parts).                       Wash face,  Genitals (private parts) with your normal soap.             6.  Wash thoroughly, paying special attention to the area where your surgery  will be performed.  7.  Thoroughly rinse your body with warm water from  the neck down.  8.  DO NOT shower/wash with your normal soap after using and rinsing off  the CHG Soap.             9.  Pat yourself dry with a clean towel.            10.  Wear clean pajamas.            11.  Place clean sheets on your bed the night of your first shower and do not  sleep with pets. Day of Surgery : Do not apply any lotions/deodorants the morning of surgery.  Please wear clean clothes to the hospital/surgery center.  FAILURE TO FOLLOW THESE INSTRUCTIONS MAY RESULT IN THE CANCELLATION OF YOUR SURGERY PATIENT SIGNATURE_________________________________  NURSE SIGNATURE__________________________________  ________________________________________________________________________

## 2019-08-21 ENCOUNTER — Encounter (HOSPITAL_COMMUNITY): Payer: Self-pay

## 2019-08-21 ENCOUNTER — Encounter (HOSPITAL_COMMUNITY)
Admission: RE | Admit: 2019-08-21 | Discharge: 2019-08-21 | Disposition: A | Discharge status: Home / Self Care | Payer: Medicare Other | Source: Ambulatory Visit | Attending: Orthopedic Surgery | Admitting: Orthopedic Surgery

## 2019-08-21 ENCOUNTER — Other Ambulatory Visit: Payer: Self-pay

## 2019-08-21 DIAGNOSIS — Z01812 Encounter for preprocedural laboratory examination: Secondary | ICD-10-CM | POA: Diagnosis not present

## 2019-08-21 HISTORY — DX: Respiratory tuberculosis unspecified: A15.9

## 2019-08-21 LAB — SURGICAL PCR SCREEN
MRSA, PCR: NEGATIVE
Staphylococcus aureus: NEGATIVE

## 2019-08-21 NOTE — Progress Notes (Addendum)
COVID Vaccine Completed:no Date COVID Vaccine completed: COVID vaccine manufacturer: Pfizer    Moderna   [employer]   PCP - Dr. Winn Jock Cardiologist - no  Chest x-ray - no EKG - 09/01/19 Stress Test - no ECHO - no Cardiac Cath - no  Sleep Study - no CPAP -   Fasting Blood Sugar - NA Checks Blood Sugar _____ times a day  Blood Thinner Instructions:ASA 81mg  Aspirin Instructions:none Last Dose:  Anesthesia review:   Patient denies shortness of breath, fever, cough and chest pain at PAT appointment yes  Patient verbalized understanding of instructions that were given to them at the PAT appointment. Patient was also instructed that they will need to review over the PAT instructions again at home before surgery. yes

## 2019-08-23 ENCOUNTER — Other Ambulatory Visit: Payer: Self-pay

## 2019-08-23 ENCOUNTER — Ambulatory Visit (INDEPENDENT_AMBULATORY_CARE_PROVIDER_SITE_OTHER): Payer: Medicare Other | Admitting: *Deleted

## 2019-08-23 VITALS — BP 132/76 | HR 56 | Temp 97.9°F | Resp 16 | Wt 176.4 lb

## 2019-08-23 DIAGNOSIS — Z7901 Long term (current) use of anticoagulants: Secondary | ICD-10-CM | POA: Diagnosis not present

## 2019-08-23 DIAGNOSIS — Z01818 Encounter for other preprocedural examination: Secondary | ICD-10-CM | POA: Diagnosis not present

## 2019-08-23 DIAGNOSIS — Z1231 Encounter for screening mammogram for malignant neoplasm of breast: Secondary | ICD-10-CM | POA: Diagnosis not present

## 2019-08-23 LAB — HM MAMMOGRAPHY

## 2019-08-23 NOTE — Progress Notes (Signed)
Patient is here for a NV for a pre-operative PT/INR and urinalysis. She states she has upcoming hip replacement. She states she takes ASA 81 mg every other day.

## 2019-08-23 NOTE — Addendum Note (Signed)
Addended by: Valrie Hart C on: 08/23/2019 02:32 PM   Modules accepted: Level of Service

## 2019-08-24 ENCOUNTER — Other Ambulatory Visit: Payer: Self-pay

## 2019-08-24 ENCOUNTER — Ambulatory Visit: Payer: Self-pay

## 2019-08-24 DIAGNOSIS — Z79899 Other long term (current) drug therapy: Secondary | ICD-10-CM | POA: Diagnosis not present

## 2019-08-24 LAB — PROTIME-INR
INR: 1
Prothrombin Time: 10.7 s (ref 9.0–11.5)

## 2019-08-24 LAB — URINALYSIS, ROUTINE W REFLEX MICROSCOPIC
Bilirubin Urine: NEGATIVE
Glucose, UA: NEGATIVE
Hgb urine dipstick: NEGATIVE
Hyaline Cast: NONE SEEN /LPF
Ketones, ur: NEGATIVE
Nitrite: NEGATIVE
Protein, ur: NEGATIVE
Specific Gravity, Urine: 1.021 (ref 1.001–1.03)
pH: 5.5 (ref 5.0–8.0)

## 2019-08-25 NOTE — Patient Instructions (Signed)
DUE TO COVID-19 ONLY ONE VISITOR IS ALLOWED TO COME WITH YOU AND STAY IN THE WAITING ROOM ONLY DURING PRE OP AND PROCEDURE DAY OF SURGERY. THE 1 VISITOR MAY VISIT WITH YOU AFTER SURGERY IN YOUR PRIVATE ROOM DURING VISITING HOURS ONLY!  YOU NEED TO HAVE A COVID 19 TEST ON_______ @_______ , THIS TEST MUST BE DONE BEFORE SURGERY, COME  [address], [place] , .  [facility]) ONCE YOUR COVID TEST IS COMPLETED, PLEASE BEGIN THE QUARANTINE INSTRUCTIONS AS OUTLINED IN YOUR HANDOUT.                Allison Barker  08/25/2019   Your procedure is scheduled on:    Report to [facility] Main  Entrance   Report to admitting at AM     Call this number if you have problems the morning of surgery [phone]    Remember: Do not eat food or drink liquids :After Midnight. BRUSH YOUR TEETH MORNING OF SURGERY AND RINSE YOUR MOUTH OUT, NO CHEWING GUM CANDY OR MINTS.     Take these medicines the morning of surgery with A SIP OF WATER:   DO NOT TAKE ANY DIABETIC MEDICATIONS DAY OF YOUR SURGERY                               You may not have any metal on your body including hair pins and              piercings  Do not wear jewelry, make-up, lotions, powders or perfumes, deodorant             Do not wear nail polish on your fingernails.  Do not shave  48 hours prior to surgery.              Men may shave face and neck.   Do not bring valuables to the hospital. [facility] IS NOT             RESPONSIBLE   FOR VALUABLES.  Contacts, dentures or bridgework may not be worn into surgery.  Leave suitcase in the car. After surgery it may be brought to your room.     Patients discharged the day of surgery will not be allowed to drive home. IF YOU ARE HAVING SURGERY AND GOING HOME THE SAME DAY, YOU MUST HAVE AN ADULT TO DRIVE YOU HOME AND BE WITH YOU FOR 24 HOURS. YOU MAY GO HOME BY TAXI OR UBER OR ORTHERWISE, BUT AN ADULT MUST ACCOMPANY YOU HOME AND STAY WITH YOU FOR 24  HOURS.  Name and phone number of your driver:  Special Instructions: N/A              Please read over the following fact sheets you were given: _____________________________________________________________________             [facility] - Preparing for Surgery Before surgery, you can play an important role.  Because skin is not sterile, your skin needs to be as free of germs as possible.  You can reduce the number of germs on your skin by washing with CHG (chlorahexidine gluconate) soap before surgery.  CHG is an antiseptic cleaner which kills germs and bonds with the skin to continue killing germs even after washing. Please DO NOT use if you have an allergy to CHG or antibacterial soaps.  If your skin becomes reddened/irritated stop using the CHG and inform your  nurse when you arrive at Short Stay. Do not shave (including legs and underarms) for at least 48 hours prior to the first CHG shower.  You may shave your face/neck. Please follow these instructions carefully:  1.  Shower with CHG Soap the night before surgery and the  morning of Surgery.  2.  If you choose to wash your hair, wash your hair first as usual with your  normal  shampoo.  3.  After you shampoo, rinse your hair and body thoroughly to remove the  shampoo.                           4.  Use CHG as you would any other liquid soap.  You can apply chg directly  to the skin and wash                       Gently with a scrungie or clean washcloth.  5.  Apply the CHG Soap to your body ONLY FROM THE NECK DOWN.   Do not use on face/ open                           Wound or open sores. Avoid contact with eyes, ears mouth and genitals (private parts).                       Wash face,  Genitals (private parts) with your normal soap.             6.  Wash thoroughly, paying special attention to the area where your surgery  will be performed.  7.  Thoroughly rinse your body with warm water from the neck down.  8.  DO NOT shower/wash with  your normal soap after using and rinsing off  the CHG Soap.                9.  Pat yourself dry with a clean towel.            10.  Wear clean pajamas.            11.  Place clean sheets on your bed the night of your first shower and do not  sleep with pets. Day of Surgery : Do not apply any lotions/deodorants the morning of surgery.  Please wear clean clothes to the hospital/surgery center.  FAILURE TO FOLLOW THESE INSTRUCTIONS MAY RESULT IN THE CANCELLATION OF YOUR SURGERY PATIENT SIGNATURE_________________________________  NURSE SIGNATURE__________________________________  ________________________________________________________________________

## 2019-08-26 LAB — URINE CULTURE
MICRO NUMBER:: 10577788
SPECIMEN QUALITY:: ADEQUATE

## 2019-08-28 ENCOUNTER — Telehealth: Payer: Self-pay

## 2019-08-28 ENCOUNTER — Other Ambulatory Visit: Payer: Self-pay | Admitting: Physician Assistant

## 2019-08-28 ENCOUNTER — Other Ambulatory Visit: Payer: Self-pay | Admitting: Adult Health

## 2019-08-28 DIAGNOSIS — F411 Generalized anxiety disorder: Secondary | ICD-10-CM

## 2019-08-28 MED ORDER — SULFAMETHOXAZOLE-TRIMETHOPRIM 800-160 MG PO TABS
1.0000 | ORAL_TABLET | Freq: Two times a day (BID) | ORAL | 0 refills | Status: DC
Start: 2019-08-28 — End: 2020-01-18

## 2019-08-28 NOTE — Telephone Encounter (Signed)
Refill request for Alprazolam, feeling more anxious and can't sleep due to upcoming surgery. Please advise.

## 2019-08-28 NOTE — Progress Notes (Deleted)
Future Appointments  Date Time Provider Department Center  09/01/2019 11:15 AM [facility] PAT 6 [facility] None  09/01/2019 12:20 PM [facility] None  01/11/2020  2:30 PM Quentin Mulling, PA-C [facility] None  06/24/2020  2:00 PM Quentin Mulling, PA-C [facility] None   PDMP checked for xanax refill request.

## 2019-08-31 ENCOUNTER — Encounter (HOSPITAL_COMMUNITY): Payer: PPO

## 2019-08-31 NOTE — H&P (Signed)
TOTAL HIP ADMISSION H&P  Patient is admitted for right total hip arthroplasty, anterior approach.  Subjective:  Chief Complaint: Right hip OA / pain  HPI: Allison Barker, 74 y.o. female, has a history of pain and functional disability in the right hip(s) due to arthritis and patient has failed non-surgical conservative treatments for greater than 12 weeks to include NSAID's and/or analgesics, corticosteriod injections and activity modification.  Onset of symptoms was abrupt starting <1  years ago with rapidlly worsening course since that time.The patient noted no past surgery on the right hip(s).  Patient currently rates pain in the right hip at 7 out of 10 with activity. Patient has night pain, worsening of pain with activity and weight bearing, trendelenberg gait, pain that interfers with activities of daily living and pain with passive range of motion. Patient has evidence of periarticular osteophytes and joint space narrowing by imaging studies. This condition presents safety issues increasing the risk of falls.  There is no current active infection.  Risks, benefits and expectations were discussed with the patient.  Risks including but not limited to the risk of anesthesia, blood clots, nerve damage, blood vessel damage, failure of the prosthesis, infection and up to and including death.  Patient understand the risks, benefits and expectations and wishes to proceed with surgery.    PCP: Lucky Cowboy, MD  D/C Plans:       Home (ambulatory)  Post-op Meds:       No Rx given   Tranexamic Acid:      To be given - IV   Decadron:      Is to be given  FYI:      ASA  Norco  DME:   Pt equipment arranged  PT:   HEP  Pharmacy: [facility] - [address] [place], [place]    Patient Active Problem List   Diagnosis Date Noted  . Osteoporosis 04/11/2018  . Major depression, recurrent, in partial remission  (HCC) 01/28/2015  . Medication management 10/03/2014  . Obesity  03/21/2014  . Recurrent UTI 03/21/2014  . Hypertension   . Hyperlipidemia   . Vitamin D deficiency   . Abnormal glucose    Past Medical History:  Diagnosis Date  . 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 Allergy   . Anxiety   . Arthritis   . Depression   . Hyperlipidemia   . Hypertension   . Osteopenia   . Prediabetes   . Tuberculosis    false +  . Vitamin D deficiency     Past Surgical History:  Procedure Laterality Date  . DILATION AND CURETTAGE OF UTERUS    . EYE SURGERY Left 11/2012   Cataract- Groat  . TONSILLECTOMY AND ADENOIDECTOMY      No current facility-administered medications for this encounter.   Current Outpatient Medications  Medication Sig Dispense Refill Last Dose  . Ascorbic Acid (VITAMIN C PO) Take 1 tablet by mouth 3 (three) times a week.     Marland Kitchen aspirin EC 81 MG tablet Take 81 mg by mouth every other day. In the morning     . bisoprolol-hydrochlorothiazide (ZIAC) 5-6.25 MG tablet TAKE ONE TABLET BY MOUTH EVERY DAY FOR BLOOD PRESSURE (Patient taking differently: Take 1 tablet by mouth daily. ) 90 tablet 1   . cetirizine (ZYRTEC) 10 MG tablet Take 10 mg by mouth daily as needed (allergies.).      Marland Kitchen Cholecalciferol (VITAMIN D3)  50 MCG (2000 UT) TABS Take 2,000 Units by mouth daily.     . Coenzyme Q10 (COQ10) 100 MG CAPS Take 100 mg by mouth 2 (two) times a week.     . escitalopram (LEXAPRO) 20 MG tablet Take 1 tablet Daily for Mood (Patient taking differently: Take 20 mg by mouth daily. ) 90 tablet 3   . furosemide (LASIX) 40 MG tablet Take 1 tablet Daily for BP / Fluid Retention / Ankle Swelling (Patient taking differently: Take 40 mg by mouth daily. ) 90 tablet 3   . Multiple Vitamin (MULTIVITAMINS PO) Take 1 packet by mouth daily. [place] [facility]     . pravastatin (PRAVACHOL) 40 MG tablet Take 1 tablet at Bedtime for Cholesterol (Patient taking differently: Take 40 mg by mouth at bedtime. ) 90 tablet 3   . prednisoLONE acetate (PRED  FORTE) 1 % ophthalmic suspension Place 1 drop into the left eye daily.   1   . TURMERIC PO Take 1,000 mg by mouth 3 (three) times a week.     . valACYclovir (VALTREX) 500 MG tablet Take 500 mg by mouth daily.      Marland Kitchen acyclovir (ZOVIRAX) 400 MG tablet Take 1 tablet (400 mg total) by mouth 3 (three) times daily. (Patient not taking: Reported on 08/15/2019) 90 tablet 2 Not Taking at Unknown time  . alendronate (FOSAMAX) 70 MG tablet Take 1 tablet (70 mg total) by mouth once a week. (Patient not taking: Reported on 08/15/2019) 12 tablet 3 Not Taking at Unknown time  . ALPRAZolam (XANAX) 0.5 MG tablet TAKE 1/2-1 TABLET BY MOUTH DAILY AT BEDTIME ONLY IF NEEDED FOR INSOMNIA. AVOID TAKING DAILY DUE TO RISK OF ADDICTION. 30 tablet 1   . sulfamethoxazole-trimethoprim (BACTRIM DS) 800-160 MG tablet Take 1 tablet by mouth 2 (two) times daily. 10 tablet 0   . triamcinolone ointment (KENALOG) 0.1 % Apply 1 application topically 2 (two) times daily. (Patient not taking: Reported on 08/15/2019) 80 g 1 Not Taking at Unknown time   Allergies  Allergen Reactions  . Ppd [Tuberculin Purified Protein Derivative]     Social History   Tobacco Use  . Smoking status: Never Smoker  . Smokeless tobacco: Never Used  Substance Use Topics  . Alcohol use: Yes    Comment: occa.    Family History  Problem Relation Age of Onset  . Hyperlipidemia Mother   . Heart disease Mother   . AAA (abdominal aortic aneurysm) Mother   . Hypertension Father   . Diabetes Father      Review of Systems  Constitutional: Negative.   HENT: Negative.   Eyes: Negative.   Respiratory: Negative.   Cardiovascular: Negative.   Gastrointestinal: Negative.   Genitourinary: Negative.   Musculoskeletal: Positive for joint pain.  Skin: Negative.   Neurological: Negative.   Endo/Heme/Allergies: Positive for environmental allergies.  Psychiatric/Behavioral: Positive for depression. The patient is nervous/anxious.       Objective:  Physical  Exam  Constitutional: She is oriented to person, place, and time. She appears well-developed.  HENT:  Head: Normocephalic.  Eyes: Pupils are equal, round, and reactive to light.  Neck: No JVD present. No tracheal deviation present. No thyromegaly present.  Cardiovascular: Normal rate and regular rhythm.  Respiratory: Effort normal and breath sounds normal. No respiratory distress. She has no wheezes.  GI: Soft. There is no abdominal tenderness. There is no guarding.  Musculoskeletal:     Cervical back: Neck supple.  Right hip: Tenderness and bony tenderness present. No deformity. Decreased range of motion. Decreased strength.  Lymphadenopathy:    She has no cervical adenopathy.  Neurological: She is alert and oriented to person, place, and time.  Skin: Skin is warm and dry.      Labs:  Estimated body mass index is 31.25 kg/m as calculated from the following:   Height as of 07/13/19: 5\' 3"  (1.6 m).   Weight as of 08/23/19: 80 kg.   Imaging Review Plain radiographs demonstrate severe degenerative joint disease of the right hip. The bone quality appears to be good for age and reported activity level.      Assessment/Plan:  End stage arthritis, right hip  The patient history, physical examination, clinical judgement of the provider and imaging studies are consistent with end stage degenerative joint disease of the right hip and total hip arthroplasty is deemed medically necessary. The treatment options including medical management, injection therapy, arthroscopy and arthroplasty were discussed at length. The risks and benefits of total hip arthroplasty were presented and reviewed. The risks due to aseptic loosening, infection, stiffness, dislocation/subluxation,  thromboembolic complications and other imponderables were discussed.  The patient acknowledged the explanation, agreed to proceed with the plan and consent was signed. Patient is being admitted for treatment for surgery,  pain control, PT, OT, prophylactic antibiotics, VTE prophylaxis, progressive ambulation and ADL's and discharge planning.The patient is planning to be discharged home.    [place] Pugh Mykiah Schmuck   PA-C  08/31/2019, 8:39 AM

## 2019-09-01 ENCOUNTER — Other Ambulatory Visit: Payer: Self-pay

## 2019-09-01 ENCOUNTER — Encounter (HOSPITAL_COMMUNITY)
Admission: RE | Admit: 2019-09-01 | Discharge: 2019-09-01 | Disposition: A | Discharge status: Home / Self Care | Payer: Medicare Other | Source: Ambulatory Visit | Attending: Orthopedic Surgery | Admitting: Orthopedic Surgery

## 2019-09-01 ENCOUNTER — Other Ambulatory Visit (HOSPITAL_COMMUNITY)
Admission: RE | Admit: 2019-09-01 | Discharge: 2019-09-01 | Disposition: A | Discharge status: Home / Self Care | Payer: Medicare Other | Source: Ambulatory Visit | Attending: Orthopedic Surgery | Admitting: Orthopedic Surgery

## 2019-09-01 DIAGNOSIS — I10 Essential (primary) hypertension: Secondary | ICD-10-CM | POA: Diagnosis not present

## 2019-09-01 DIAGNOSIS — Z01818 Encounter for other preprocedural examination: Secondary | ICD-10-CM | POA: Insufficient documentation

## 2019-09-01 DIAGNOSIS — Z20822 Contact with and (suspected) exposure to covid-19: Secondary | ICD-10-CM | POA: Insufficient documentation

## 2019-09-01 LAB — CBC
HCT: 46.4 % — ABNORMAL HIGH (ref 36.0–46.0)
Hemoglobin: 15.9 g/dL — ABNORMAL HIGH (ref 12.0–15.0)
MCH: 34 pg (ref 26.0–34.0)
MCHC: 34.3 g/dL (ref 30.0–36.0)
MCV: 99.4 fL (ref 80.0–100.0)
Platelets: 311 10*3/uL (ref 150–400)
RBC: 4.67 MIL/uL (ref 3.87–5.11)
RDW: 12.4 % (ref 11.5–15.5)
WBC: 8.2 10*3/uL (ref 4.0–10.5)
nRBC: 0 % (ref 0.0–0.2)

## 2019-09-01 LAB — BASIC METABOLIC PANEL
Anion gap: 11 (ref 5–15)
BUN: 16 mg/dL (ref 8–23)
CO2: 22 mmol/L (ref 22–32)
Calcium: 9.1 mg/dL (ref 8.9–10.3)
Chloride: 104 mmol/L (ref 98–111)
Creatinine, Ser: 1.04 mg/dL — ABNORMAL HIGH (ref 0.44–1.00)
GFR calc Af Amer: >60 mL/min (ref >60)
GFR calc non Af Amer: 53 mL/min — ABNORMAL LOW (ref >60)
Glucose, Bld: 122 mg/dL — ABNORMAL HIGH (ref 70–99)
Potassium: 3.3 mmol/L — ABNORMAL LOW (ref 3.5–5.1)
Sodium: 137 mmol/L (ref 135–145)

## 2019-09-01 LAB — ABO/RH: ABO/RH(D): O NEG

## 2019-09-01 LAB — SURGICAL PCR SCREEN
MRSA, PCR: NEGATIVE
Staphylococcus aureus: NEGATIVE

## 2019-09-02 LAB — SARS CORONAVIRUS 2 (TAT 6-24 HRS): SARS Coronavirus 2: NEGATIVE

## 2019-09-05 ENCOUNTER — Other Ambulatory Visit: Payer: Self-pay

## 2019-09-05 ENCOUNTER — Observation Stay (HOSPITAL_COMMUNITY): Payer: Medicare Other

## 2019-09-05 ENCOUNTER — Ambulatory Visit (HOSPITAL_COMMUNITY): Payer: Medicare Other

## 2019-09-05 ENCOUNTER — Encounter (HOSPITAL_COMMUNITY)
Admission: RE | Disposition: A | Discharge status: Home / Self Care | Payer: Self-pay | Source: Ambulatory Visit | Attending: Orthopedic Surgery

## 2019-09-05 ENCOUNTER — Encounter (HOSPITAL_COMMUNITY): Payer: Self-pay | Admitting: Orthopedic Surgery

## 2019-09-05 ENCOUNTER — Ambulatory Visit (HOSPITAL_COMMUNITY): Payer: Medicare Other | Admitting: Certified Registered"

## 2019-09-05 ENCOUNTER — Observation Stay (HOSPITAL_COMMUNITY)
Admission: RE | Admit: 2019-09-05 | Discharge: 2019-09-06 | Disposition: A | Discharge status: Home / Self Care | Payer: Medicare Other | Source: Ambulatory Visit | Attending: Orthopedic Surgery | Admitting: Orthopedic Surgery

## 2019-09-05 DIAGNOSIS — Z888 Allergy status to other drugs, medicaments and biological substances status: Secondary | ICD-10-CM | POA: Diagnosis not present

## 2019-09-05 DIAGNOSIS — Z7952 Long term (current) use of systemic steroids: Secondary | ICD-10-CM | POA: Diagnosis not present

## 2019-09-05 DIAGNOSIS — E559 Vitamin D deficiency, unspecified: Secondary | ICD-10-CM | POA: Diagnosis not present

## 2019-09-05 DIAGNOSIS — Z7983 Long term (current) use of bisphosphonates: Secondary | ICD-10-CM | POA: Insufficient documentation

## 2019-09-05 DIAGNOSIS — E785 Hyperlipidemia, unspecified: Secondary | ICD-10-CM | POA: Diagnosis not present

## 2019-09-05 DIAGNOSIS — Z8249 Family history of ischemic heart disease and other diseases of the circulatory system: Secondary | ICD-10-CM | POA: Diagnosis not present

## 2019-09-05 DIAGNOSIS — Z8349 Family history of other endocrine, nutritional and metabolic diseases: Secondary | ICD-10-CM | POA: Diagnosis not present

## 2019-09-05 DIAGNOSIS — M8580 Other specified disorders of bone density and structure, unspecified site: Secondary | ICD-10-CM | POA: Insufficient documentation

## 2019-09-05 DIAGNOSIS — F419 Anxiety disorder, unspecified: Secondary | ICD-10-CM | POA: Insufficient documentation

## 2019-09-05 DIAGNOSIS — Z6829 Body mass index (BMI) 29.0-29.9, adult: Secondary | ICD-10-CM | POA: Insufficient documentation

## 2019-09-05 DIAGNOSIS — Z7982 Long term (current) use of aspirin: Secondary | ICD-10-CM | POA: Insufficient documentation

## 2019-09-05 DIAGNOSIS — F329 Major depressive disorder, single episode, unspecified: Secondary | ICD-10-CM | POA: Diagnosis not present

## 2019-09-05 DIAGNOSIS — Z79899 Other long term (current) drug therapy: Secondary | ICD-10-CM | POA: Diagnosis not present

## 2019-09-05 DIAGNOSIS — M1611 Unilateral primary osteoarthritis, right hip: Secondary | ICD-10-CM | POA: Diagnosis not present

## 2019-09-05 DIAGNOSIS — Z833 Family history of diabetes mellitus: Secondary | ICD-10-CM | POA: Insufficient documentation

## 2019-09-05 DIAGNOSIS — E663 Overweight: Secondary | ICD-10-CM | POA: Diagnosis present

## 2019-09-05 DIAGNOSIS — Z419 Encounter for procedure for purposes other than remedying health state, unspecified: Secondary | ICD-10-CM

## 2019-09-05 DIAGNOSIS — Z471 Aftercare following joint replacement surgery: Secondary | ICD-10-CM | POA: Diagnosis not present

## 2019-09-05 DIAGNOSIS — E669 Obesity, unspecified: Secondary | ICD-10-CM | POA: Insufficient documentation

## 2019-09-05 DIAGNOSIS — I10 Essential (primary) hypertension: Secondary | ICD-10-CM | POA: Diagnosis not present

## 2019-09-05 DIAGNOSIS — Z96649 Presence of unspecified artificial hip joint: Secondary | ICD-10-CM

## 2019-09-05 DIAGNOSIS — M810 Age-related osteoporosis without current pathological fracture: Secondary | ICD-10-CM | POA: Diagnosis not present

## 2019-09-05 DIAGNOSIS — Z96641 Presence of right artificial hip joint: Secondary | ICD-10-CM

## 2019-09-05 HISTORY — PX: TOTAL HIP ARTHROPLASTY: SHX124

## 2019-09-05 LAB — TYPE AND SCREEN
ABO/RH(D): O NEG
Antibody Screen: NEGATIVE

## 2019-09-05 SURGERY — ARTHROPLASTY, HIP, TOTAL, ANTERIOR APPROACH
Anesthesia: Spinal | Site: Hip | Laterality: Right

## 2019-09-05 MED ORDER — ONDANSETRON HCL 4 MG/2ML IJ SOLN: 4.0000 mg | Freq: Once | INTRAMUSCULAR | Status: DC | PRN

## 2019-09-05 MED ORDER — BISACODYL 10 MG RE SUPP: 10.0000 mg | Freq: Every day | RECTAL | Status: DC | PRN

## 2019-09-05 MED ORDER — FENTANYL CITRATE (PF) 100 MCG/2ML IJ SOLN
INTRAMUSCULAR | Status: AC
  Filled 2019-09-05: qty 2

## 2019-09-05 MED ORDER — PROPOFOL 500 MG/50ML IV EMUL
INTRAVENOUS | Status: DC | PRN
  Administered 2019-09-05: 75 ug/kg/min via INTRAVENOUS
  Administered 2019-09-05: 100 ug/kg/min via INTRAVENOUS

## 2019-09-05 MED ORDER — HYDROMORPHONE HCL 1 MG/ML IJ SOLN: 0.5000 mg | INTRAMUSCULAR | Status: DC | PRN

## 2019-09-05 MED ORDER — HYDROCODONE-ACETAMINOPHEN 7.5-325 MG PO TABS
1.0000 | ORAL_TABLET | ORAL | Status: DC | PRN
  Administered 2019-09-05 – 2019-09-06 (×3): 1 via ORAL
  Filled 2019-09-05 (×3): qty 1

## 2019-09-05 MED ORDER — ONDANSETRON HCL 4 MG PO TABS: 4.0000 mg | ORAL_TABLET | Freq: Four times a day (QID) | ORAL | Status: DC | PRN

## 2019-09-05 MED ORDER — FERROUS SULFATE 325 (65 FE) MG PO TABS: 325.0000 mg | ORAL_TABLET | Freq: Three times a day (TID) | ORAL | 0 refills | Status: AC

## 2019-09-05 MED ORDER — ASPIRIN 81 MG PO CHEW
81.0000 mg | CHEWABLE_TABLET | Freq: Two times a day (BID) | ORAL | 0 refills | Status: AC
Start: 2019-09-06 — End: 2019-10-06

## 2019-09-05 MED ORDER — DEXAMETHASONE SODIUM PHOSPHATE 10 MG/ML IJ SOLN
10.0000 mg | Freq: Once | INTRAMUSCULAR | Status: DC
  Filled 2019-09-05: qty 1

## 2019-09-05 MED ORDER — PHENYLEPHRINE 40 MCG/ML (10ML) SYRINGE FOR IV PUSH (FOR BLOOD PRESSURE SUPPORT)
PREFILLED_SYRINGE | INTRAVENOUS | Status: DC | PRN
  Administered 2019-09-05 (×3): 40 ug via INTRAVENOUS
  Administered 2019-09-05: 80 ug via INTRAVENOUS
  Administered 2019-09-05 (×5): 40 ug via INTRAVENOUS

## 2019-09-05 MED ORDER — CEFAZOLIN SODIUM-DEXTROSE 2-4 GM/100ML-% IV SOLN
2.0000 g | INTRAVENOUS | Status: AC
  Administered 2019-09-05: 2 g via INTRAVENOUS
  Filled 2019-09-05: qty 100

## 2019-09-05 MED ORDER — SODIUM CHLORIDE 0.9 % IR SOLN
Status: DC | PRN
  Administered 2019-09-05: 1000 mL

## 2019-09-05 MED ORDER — LACTATED RINGERS IV SOLN: INTRAVENOUS | Status: DC

## 2019-09-05 MED ORDER — MENTHOL 3 MG MT LOZG: 1.0000 | LOZENGE | OROMUCOSAL | Status: DC | PRN

## 2019-09-05 MED ORDER — DOCUSATE SODIUM 100 MG PO CAPS
100.0000 mg | ORAL_CAPSULE | Freq: Two times a day (BID) | ORAL | Status: DC
  Administered 2019-09-05 – 2019-09-06 (×2): 100 mg via ORAL
  Filled 2019-09-05 (×2): qty 1

## 2019-09-05 MED ORDER — PHENYLEPHRINE 40 MCG/ML (10ML) SYRINGE FOR IV PUSH (FOR BLOOD PRESSURE SUPPORT)
PREFILLED_SYRINGE | INTRAVENOUS | Status: AC
  Filled 2019-09-05: qty 10

## 2019-09-05 MED ORDER — PREDNISOLONE ACETATE 1 % OP SUSP
1.0000 [drp] | Freq: Every day | OPHTHALMIC | Status: DC
  Filled 2019-09-05: qty 5

## 2019-09-05 MED ORDER — POLYETHYLENE GLYCOL 3350 17 G PO PACK
17.0000 g | PACK | Freq: Two times a day (BID) | ORAL | Status: DC
  Administered 2019-09-05 – 2019-09-06 (×2): 17 g via ORAL
  Filled 2019-09-05 (×2): qty 1

## 2019-09-05 MED ORDER — DEXAMETHASONE SODIUM PHOSPHATE 10 MG/ML IJ SOLN
INTRAMUSCULAR | Status: AC
  Filled 2019-09-05: qty 1

## 2019-09-05 MED ORDER — PRAVASTATIN SODIUM 20 MG PO TABS
40.0000 mg | ORAL_TABLET | Freq: Every day | ORAL | Status: DC
  Administered 2019-09-05: 40 mg via ORAL
  Filled 2019-09-05: qty 2

## 2019-09-05 MED ORDER — CEFAZOLIN SODIUM-DEXTROSE 2-4 GM/100ML-% IV SOLN
2.0000 g | Freq: Four times a day (QID) | INTRAVENOUS | Status: AC
  Administered 2019-09-05 (×2): 2 g via INTRAVENOUS
  Filled 2019-09-05 (×2): qty 100

## 2019-09-05 MED ORDER — METOCLOPRAMIDE HCL 5 MG PO TABS: 5.0000 mg | ORAL_TABLET | Freq: Three times a day (TID) | ORAL | Status: DC | PRN

## 2019-09-05 MED ORDER — HYDROCODONE-ACETAMINOPHEN 5-325 MG PO TABS
1.0000 | ORAL_TABLET | ORAL | Status: DC | PRN
  Administered 2019-09-05: 2 via ORAL
  Administered 2019-09-06: 1 via ORAL
  Filled 2019-09-05: qty 1
  Filled 2019-09-05: qty 2

## 2019-09-05 MED ORDER — ASPIRIN 81 MG PO CHEW
81.0000 mg | CHEWABLE_TABLET | Freq: Two times a day (BID) | ORAL | Status: DC
  Administered 2019-09-05 – 2019-09-06 (×2): 81 mg via ORAL
  Filled 2019-09-05 (×2): qty 1

## 2019-09-05 MED ORDER — BISOPROLOL-HYDROCHLOROTHIAZIDE 5-6.25 MG PO TABS
1.0000 | ORAL_TABLET | Freq: Every day | ORAL | Status: DC
  Administered 2019-09-05 – 2019-09-06 (×2): 1 via ORAL
  Filled 2019-09-05 (×2): qty 1

## 2019-09-05 MED ORDER — DOCUSATE SODIUM 100 MG PO CAPS
100.0000 mg | ORAL_CAPSULE | Freq: Two times a day (BID) | ORAL | 0 refills | Status: AC
Start: 2019-09-05 — End: ?

## 2019-09-05 MED ORDER — LACTATED RINGERS IV BOLUS: 500.0000 mL | Freq: Once | INTRAVENOUS | Status: DC

## 2019-09-05 MED ORDER — FUROSEMIDE 40 MG PO TABS
40.0000 mg | ORAL_TABLET | Freq: Every day | ORAL | Status: DC
  Administered 2019-09-05 – 2019-09-06 (×2): 40 mg via ORAL
  Filled 2019-09-05 (×2): qty 1

## 2019-09-05 MED ORDER — ALUM & MAG HYDROXIDE-SIMETH 200-200-20 MG/5ML PO SUSP: 15.0000 mL | ORAL | Status: DC | PRN

## 2019-09-05 MED ORDER — BUPIVACAINE IN DEXTROSE 0.75-8.25 % IT SOLN
INTRATHECAL | Status: DC | PRN
  Administered 2019-09-05: 1.6 mL via INTRATHECAL

## 2019-09-05 MED ORDER — METHOCARBAMOL 500 MG IVPB - SIMPLE MED
500.0000 mg | Freq: Four times a day (QID) | INTRAVENOUS | Status: DC | PRN
  Filled 2019-09-05: qty 50

## 2019-09-05 MED ORDER — ACETAMINOPHEN 325 MG PO TABS: 325.0000 mg | ORAL_TABLET | Freq: Four times a day (QID) | ORAL | Status: DC | PRN

## 2019-09-05 MED ORDER — ALPRAZOLAM 0.25 MG PO TABS: 0.2500 mg | ORAL_TABLET | Freq: Every evening | ORAL | Status: DC | PRN

## 2019-09-05 MED ORDER — FENTANYL CITRATE (PF) 100 MCG/2ML IJ SOLN: 25.0000 ug | INTRAMUSCULAR | Status: DC | PRN

## 2019-09-05 MED ORDER — FENTANYL CITRATE (PF) 100 MCG/2ML IJ SOLN
INTRAMUSCULAR | Status: DC | PRN
  Administered 2019-09-05 (×2): 25 ug via INTRAVENOUS

## 2019-09-05 MED ORDER — MEPERIDINE HCL 50 MG/ML IJ SOLN: 6.2500 mg | INTRAMUSCULAR | Status: DC | PRN

## 2019-09-05 MED ORDER — METHOCARBAMOL 500 MG PO TABS: 500.0000 mg | ORAL_TABLET | Freq: Four times a day (QID) | ORAL | 0 refills | Status: DC | PRN

## 2019-09-05 MED ORDER — OXYCODONE HCL 5 MG PO TABS
5.0000 mg | ORAL_TABLET | Freq: Once | ORAL | Status: AC | PRN
  Administered 2019-09-05: 5 mg via ORAL

## 2019-09-05 MED ORDER — PHENYLEPHRINE HCL-NACL 20-0.9 MG/250ML-% IV SOLN
INTRAVENOUS | Status: DC | PRN
  Administered 2019-09-05: 40 ug/min via INTRAVENOUS

## 2019-09-05 MED ORDER — MAGNESIUM CITRATE PO SOLN: 1.0000 | Freq: Once | ORAL | Status: DC | PRN

## 2019-09-05 MED ORDER — HYDROCODONE-ACETAMINOPHEN 7.5-325 MG PO TABS: 1.0000 | ORAL_TABLET | ORAL | 0 refills | Status: AC | PRN

## 2019-09-05 MED ORDER — DIPHENHYDRAMINE HCL 12.5 MG/5ML PO ELIX: 12.5000 mg | ORAL_SOLUTION | ORAL | Status: DC | PRN

## 2019-09-05 MED ORDER — METOCLOPRAMIDE HCL 5 MG/ML IJ SOLN: 5.0000 mg | Freq: Three times a day (TID) | INTRAMUSCULAR | Status: DC | PRN

## 2019-09-05 MED ORDER — PHENYLEPHRINE 40 MCG/ML (10ML) SYRINGE FOR IV PUSH (FOR BLOOD PRESSURE SUPPORT)
PREFILLED_SYRINGE | INTRAVENOUS | Status: AC
  Filled 2019-09-05: qty 20

## 2019-09-05 MED ORDER — PHENYLEPHRINE HCL (PRESSORS) 10 MG/ML IV SOLN
INTRAVENOUS | Status: AC
  Filled 2019-09-05: qty 1

## 2019-09-05 MED ORDER — POLYETHYLENE GLYCOL 3350 17 G PO PACK
17.0000 g | PACK | Freq: Two times a day (BID) | ORAL | 0 refills | Status: DC
Start: 2019-09-05 — End: 2020-01-18

## 2019-09-05 MED ORDER — OXYCODONE HCL 5 MG/5ML PO SOLN: 5.0000 mg | Freq: Once | ORAL | Status: AC | PRN

## 2019-09-05 MED ORDER — LACTATED RINGERS IV BOLUS
250.0000 mL | Freq: Once | INTRAVENOUS | Status: AC
  Administered 2019-09-05: 250 mL via INTRAVENOUS

## 2019-09-05 MED ORDER — DEXAMETHASONE SODIUM PHOSPHATE 10 MG/ML IJ SOLN
10.0000 mg | Freq: Once | INTRAMUSCULAR | Status: AC
  Administered 2019-09-05: 10 mg via INTRAVENOUS

## 2019-09-05 MED ORDER — ONDANSETRON HCL 4 MG/2ML IJ SOLN: 4.0000 mg | Freq: Four times a day (QID) | INTRAMUSCULAR | Status: DC | PRN

## 2019-09-05 MED ORDER — EPHEDRINE SULFATE-NACL 50-0.9 MG/10ML-% IV SOSY
PREFILLED_SYRINGE | INTRAVENOUS | Status: DC | PRN
  Administered 2019-09-05: 10 mg via INTRAVENOUS
  Administered 2019-09-05 (×8): 5 mg via INTRAVENOUS

## 2019-09-05 MED ORDER — CELECOXIB 200 MG PO CAPS
200.0000 mg | ORAL_CAPSULE | Freq: Two times a day (BID) | ORAL | Status: DC
  Administered 2019-09-05 – 2019-09-06 (×2): 200 mg via ORAL
  Filled 2019-09-05 (×2): qty 1

## 2019-09-05 MED ORDER — VALACYCLOVIR HCL 500 MG PO TABS
500.0000 mg | ORAL_TABLET | Freq: Every day | ORAL | Status: DC
  Administered 2019-09-06: 500 mg via ORAL
  Filled 2019-09-05 (×2): qty 1

## 2019-09-05 MED ORDER — LORATADINE 10 MG PO TABS: 10.0000 mg | ORAL_TABLET | Freq: Every day | ORAL | Status: DC | PRN

## 2019-09-05 MED ORDER — ORAL CARE MOUTH RINSE: 15.0000 mL | Freq: Once | OROMUCOSAL | Status: AC

## 2019-09-05 MED ORDER — METHOCARBAMOL 500 MG IVPB - SIMPLE MED
INTRAVENOUS | Status: AC
  Administered 2019-09-05: 500 mg via INTRAVENOUS
  Filled 2019-09-05: qty 50

## 2019-09-05 MED ORDER — METHOCARBAMOL 500 MG PO TABS
500.0000 mg | ORAL_TABLET | Freq: Four times a day (QID) | ORAL | Status: DC | PRN
  Administered 2019-09-05: 500 mg via ORAL
  Filled 2019-09-05: qty 1

## 2019-09-05 MED ORDER — CHLORHEXIDINE GLUCONATE 0.12 % MT SOLN
15.0000 mL | Freq: Once | OROMUCOSAL | Status: AC
  Administered 2019-09-05: 15 mL via OROMUCOSAL

## 2019-09-05 MED ORDER — ESCITALOPRAM OXALATE 20 MG PO TABS
20.0000 mg | ORAL_TABLET | Freq: Every day | ORAL | Status: DC
  Administered 2019-09-05 – 2019-09-06 (×2): 20 mg via ORAL
  Filled 2019-09-05 (×2): qty 1

## 2019-09-05 MED ORDER — OXYCODONE HCL 5 MG PO TABS
ORAL_TABLET | ORAL | Status: AC
  Filled 2019-09-05: qty 1

## 2019-09-05 MED ORDER — TRANEXAMIC ACID-NACL 1000-0.7 MG/100ML-% IV SOLN
1000.0000 mg | Freq: Once | INTRAVENOUS | Status: DC
  Filled 2019-09-05: qty 100

## 2019-09-05 MED ORDER — TRANEXAMIC ACID-NACL 1000-0.7 MG/100ML-% IV SOLN
1000.0000 mg | INTRAVENOUS | Status: AC
  Administered 2019-09-05: 1000 mg via INTRAVENOUS
  Filled 2019-09-05: qty 100

## 2019-09-05 MED ORDER — PHENOL 1.4 % MT LIQD: 1.0000 | OROMUCOSAL | Status: DC | PRN

## 2019-09-05 MED ORDER — SODIUM CHLORIDE 0.9 % IV SOLN: INTRAVENOUS | Status: DC

## 2019-09-05 MED ORDER — FERROUS SULFATE 325 (65 FE) MG PO TABS
325.0000 mg | ORAL_TABLET | Freq: Three times a day (TID) | ORAL | Status: DC
  Administered 2019-09-05 – 2019-09-06 (×2): 325 mg via ORAL
  Filled 2019-09-05 (×2): qty 1

## 2019-09-05 SURGICAL SUPPLY — 51 items
ADH SKN CLS APL DERMABOND .7 (GAUZE/BANDAGES/DRESSINGS) ×1
BAG DECANTER FOR FLEXI CONT (MISCELLANEOUS) IMPLANT
BAG SPEC THK2 15X12 ZIP CLS (MISCELLANEOUS) ×1
BAG ZIPLOCK 12X15 (MISCELLANEOUS) ×2 IMPLANT
BLADE SAG 18X100X1.27 (BLADE) ×3 IMPLANT
BLADE SURG SZ10 CARB STEEL (BLADE) ×6 IMPLANT
COVER PERINEAL POST (MISCELLANEOUS) ×3 IMPLANT
COVER SURGICAL LIGHT HANDLE (MISCELLANEOUS) ×3 IMPLANT
COVER WAND RF STERILE (DRAPES) IMPLANT
CUP ACET PINNACLE SECTR 50MM (Hips) IMPLANT
DERMABOND ADVANCED (GAUZE/BANDAGES/DRESSINGS) ×2
DERMABOND ADVANCED .7 DNX12 (GAUZE/BANDAGES/DRESSINGS) ×1 IMPLANT
DRAPE STERI IOBAN 125X83 (DRAPES) ×3 IMPLANT
DRAPE U-SHAPE 47X51 STRL (DRAPES) ×6 IMPLANT
DRESSING AQUACEL AG SP 3.5X10 (GAUZE/BANDAGES/DRESSINGS) ×1 IMPLANT
DRSG AQUACEL AG SP 3.5X10 (GAUZE/BANDAGES/DRESSINGS) ×3
DURAPREP 26ML APPLICATOR (WOUND CARE) ×3 IMPLANT
ELECT REM PT RETURN 15FT ADLT (MISCELLANEOUS) ×3 IMPLANT
ELIMINATOR HOLE APEX DEPUY (Hips) ×3 IMPLANT
FEM STEM 12/14 TAPER SZ 4 HIP (Orthopedic Implant) ×3 IMPLANT
FEMORAL STEM 12/14 TPR SZ4 HIP (Orthopedic Implant) ×1 IMPLANT
GLOVE BIO SURGEON STRL SZ 6 (GLOVE) ×6 IMPLANT
GLOVE BIOGEL PI IND STRL 6.5 (GLOVE) ×1 IMPLANT
GLOVE BIOGEL PI IND STRL 7.5 (GLOVE) ×1 IMPLANT
GLOVE BIOGEL PI IND STRL 8.5 (GLOVE) ×1 IMPLANT
GLOVE BIOGEL PI INDICATOR 6.5 (GLOVE) ×2
GLOVE BIOGEL PI INDICATOR 7.5 (GLOVE) ×2
GLOVE BIOGEL PI INDICATOR 8.5 (GLOVE) ×2
GLOVE ECLIPSE 8.0 STRL XLNG CF (GLOVE) ×6 IMPLANT
GLOVE ORTHO TXT STRL SZ7.5 (GLOVE) ×6 IMPLANT
GOWN STRL REUS W/TWL LRG LVL3 (GOWN DISPOSABLE) ×6 IMPLANT
GOWN STRL REUS W/TWL XL LVL3 (GOWN DISPOSABLE) ×3 IMPLANT
HEAD FEMORAL 32 CERAMIC (Hips) ×3 IMPLANT
HOLDER FOLEY CATH W/STRAP (MISCELLANEOUS) ×3 IMPLANT
KIT TURNOVER KIT A (KITS) IMPLANT
LINER ACET PNNCL PLUS4 NEUTRAL (Hips) ×1 IMPLANT
PACK ANTERIOR HIP CUSTOM (KITS) ×3 IMPLANT
PENCIL SMOKE EVACUATOR (MISCELLANEOUS) IMPLANT
PINNACLE PLUS 4 NEUTRAL (Hips) ×3 IMPLANT
PINNACLE SECTOR CUP 50MM (Hips) ×3 IMPLANT
SCREW 6.5MMX25MM (Screw) ×3 IMPLANT
SLEEVE SUCTION CATH 165 (SLEEVE) ×3 IMPLANT
SUT MNCRL AB 4-0 PS2 18 (SUTURE) ×3 IMPLANT
SUT STRATAFIX 0 PDS 27 VIOLET (SUTURE) ×3
SUT VIC AB 1 CT1 36 (SUTURE) ×9 IMPLANT
SUT VIC AB 2-0 CT1 27 (SUTURE) ×6
SUT VIC AB 2-0 CT1 TAPERPNT 27 (SUTURE) ×2 IMPLANT
SUTURE STRATFX 0 PDS 27 VIOLET (SUTURE) ×1 IMPLANT
TRAY FOLEY MTR SLVR 16FR STAT (SET/KITS/TRAYS/PACK) IMPLANT
WATER STERILE IRR 1000ML POUR (IV SOLUTION) ×6 IMPLANT
YANKAUER SUCT BULB TIP 10FT TU (MISCELLANEOUS) IMPLANT

## 2019-09-05 NOTE — Discharge Instructions (Signed)

## 2019-09-05 NOTE — Anesthesia Preprocedure Evaluation (Signed)
Anesthesia Evaluation  Patient identified by MRN, date of birth, ID band Patient awake    Reviewed: Allergy & Precautions, NPO status , Patient's Chart, lab work & pertinent test results, reviewed documented beta blocker date and time   Airway Mallampati: III  TM Distance: >3 FB Neck ROM: Full    Dental no notable dental hx. (+) Teeth Intact, Caps   Pulmonary neg pulmonary ROS,    Pulmonary exam normal breath sounds clear to auscultation       Cardiovascular hypertension, Pt. on medications and Pt. on home beta blockers Normal cardiovascular exam Rhythm:Regular Rate:Normal     Neuro/Psych PSYCHIATRIC DISORDERS Anxiety Depression negative neurological ROS     GI/Hepatic negative GI ROS, Neg liver ROS,   Endo/Other  Hyperlipidemia  Renal/GU Renal InsufficiencyRenal disease  negative genitourinary   Musculoskeletal  (+) Arthritis , Osteoarthritis,  OA Right hip Osteoporosis   Abdominal (+) - obese,   Peds  Hematology negative hematology ROS (+)   Anesthesia Other Findings   Reproductive/Obstetrics                             Anesthesia Physical Anesthesia Plan  ASA: II  Anesthesia Plan: Spinal   Post-op Pain Management:    Induction: Intravenous  PONV Risk Score and Plan: 3 and Ondansetron, Dexamethasone, Propofol infusion and Treatment may vary due to age or medical condition  Airway Management Planned: Natural Airway, Nasal Cannula and Simple Face Mask  Additional Equipment:   Intra-op Plan:   Post-operative Plan:   Informed Consent: I have reviewed the patients History and Physical, chart, labs and discussed the procedure including the risks, benefits and alternatives for the proposed anesthesia with the patient or authorized representative who has indicated his/her understanding and acceptance.     Dental advisory given  Plan Discussed with: CRNA and  Surgeon  Anesthesia Plan Comments:         Anesthesia Quick Evaluation

## 2019-09-05 NOTE — Op Note (Signed)
NAME:  Allison Barker                ACCOUNT NO.: [id number]      MEDICAL RECORD NO.: [id number]      FACILITY:  [facility]      PHYSICIAN:  [name]  DATE OF BIRTH:  01/02/1946     DATE OF PROCEDURE:  09/05/2019                                 OPERATIVE REPORT         PREOPERATIVE DIAGNOSIS: Right  hip osteoarthritis.      POSTOPERATIVE DIAGNOSIS:  Right hip osteoarthritis.      PROCEDURE:  Right total hip replacement through an anterior approach   utilizing DePuy THR system, component size 50 mm pinnacle cup, a size 32+4 neutral   Altrex liner, a size 4 Hi Actis stem with a 32+1 delta ceramic   ball.      SURGEON:  Madlyn Frankel. Charlann Boxer, M.D.      ASSISTANT:  Dennie Bible, PA-C     ANESTHESIA:  Spinal.      SPECIMENS:  None.      COMPLICATIONS:  None.      BLOOD LOSS:  350 cc     DRAINS:  None.      INDICATION OF THE PROCEDURE:  Allison Barker is a 74 y.o. female who had   presented to office for evaluation of right hip pain.  Radiographs revealed   progressive degenerative changes with bone-on-bone   articulation of the  hip joint, including subchondral cystic changes and osteophytes.  The patient had painful limited range of   motion significantly affecting their overall quality of life and function.  The patient was failing to    respond to conservative measures including medications and/or injections and activity modification and at this point was ready   to proceed with more definitive measures.  Consent was obtained for   benefit of pain relief.  Specific risks of infection, DVT, component   failure, dislocation, neurovascular injury, and need for revision surgery were reviewed in the office as well discussion of   the anterior versus posterior approach were reviewed.     PROCEDURE IN DETAIL:  The patient was brought to operative theater.   Once adequate anesthesia, preoperative antibiotics, 2 gm of Ancef, 1 gm of Tranexamic Acid, and 10 mg  of Decadron were administered, the patient was positioned supine on the [employer] table.  Once the patient was safely positioned with adequate padding of boney prominences we predraped out the hip, and used fluoroscopy to confirm orientation of the pelvis.      The right hip was then prepped and draped from proximal iliac crest to   mid thigh with a shower curtain technique.      Time-out was performed identifying the patient, planned procedure, and the appropriate extremity.     An incision was then made 2 cm lateral to the   anterior superior iliac spine extending over the orientation of the   tensor fascia lata muscle and sharp dissection was carried down to the   fascia of the muscle.      The fascia was then incised.  The muscle belly was identified and swept   laterally and retractor placed along the superior neck.  Following   cauterization of the circumflex vessels and removing some  pericapsular   fat, a second cobra retractor was placed on the inferior neck.  A T-capsulotomy was made along the line of the   superior neck to the trochanteric fossa, then extended proximally and   distally.  Tag sutures were placed and the retractors were then placed   intracapsular.  We then identified the trochanteric fossa and   orientation of my neck cut and then made a neck osteotomy with the femur on traction.  The femoral   head was removed without difficulty or complication.  Traction was let   off and retractors were placed posterior and anterior around the   acetabulum.      The labrum and foveal tissue were debrided.  I began reaming with a 43 mm   reamer and reamed up to 49 mm reamer with good bony bed preparation and a 50 mm  cup was chosen.  The final 50 mm Pinnacle cup was then impacted under fluoroscopy to confirm the depth of penetration and orientation with respect to   Abduction and forward flexion.  A screw was placed into the ilium followed by the hole eliminator.  The final    32+4 neutral Altrex liner was impacted with good visualized rim fit.  The cup was positioned anatomically within the acetabular portion of the pelvis.      At this point, the femur was rolled to 100 degrees.  Further capsule was   released off the inferior aspect of the femoral neck.  I then   released the superior capsule proximally.  With the leg in a neutral position the hook was placed laterally   along the femur under the vastus lateralis origin and elevated manually and then held in position using the hook attachment on the bed.  The leg was then extended and adducted with the leg rolled to 100   degrees of external rotation.  Retractors were placed along the medial calcar and posteriorly over the greater trochanter.  Once the proximal femur was fully   exposed, I used a box osteotome to set orientation.  I then began   broaching with the starting chili pepper broach and passed this by hand and then broached up to 4.  With the 4 broach in place I chose a high offset neck and did several trial reductions.  The offset was appropriate, leg lengths   appeared to be equal best matched with the +1 head ball trial confirmed radiographically.   Given these findings, I went ahead and dislocated the hip, repositioned all   retractors and positioned the right hip in the extended and abducted position.  The final 4 Hi Actis stem was   chosen and it was impacted down to the level of neck cut.  Based on this   and the trial reductions, a final 32+1 delta ceramic ball was chosen and   impacted onto a clean and dry trunnion, and the hip was reduced.  The   hip had been irrigated throughout the case again at this point.  I did   reapproximate the superior capsular leaflet to the anterior leaflet   using #1 Vicryl.  The fascia of the   tensor fascia lata muscle was then reapproximated using #1 Vicryl and #0 Stratafix sutures.  The   remaining wound was closed with 2-0 Vicryl and running 4-0 Monocryl.   The  hip was cleaned, dried, and dressed sterilely using Dermabond and   Aquacel dressing.  The patient was then brought   to  recovery room in stable condition tolerating the procedure well.    Dennie Bible, PA-C was present for the entirety of the case involved from   preoperative positioning, perioperative retractor management, general   facilitation of the case, as well as primary wound closure as assistant.            Madlyn Frankel Charlann Boxer, M.D.        09/05/2019 11:20 AM

## 2019-09-05 NOTE — Anesthesia Postprocedure Evaluation (Signed)
Anesthesia Post Note  Patient: Allison Barker  Procedure(s) Performed: TOTAL HIP ARTHROPLASTY ANTERIOR APPROACH (Right Hip)     Patient location during evaluation: PACU Anesthesia Type: Spinal Level of consciousness: oriented and awake and alert Pain management: pain level controlled Vital Signs Assessment: post-procedure vital signs reviewed and stable Respiratory status: spontaneous breathing, respiratory function stable and nonlabored ventilation Cardiovascular status: blood pressure returned to baseline and stable Postop Assessment: no headache, no backache, no apparent nausea or vomiting, spinal receding and patient able to bend at knees Anesthetic complications: no   No complications documented.  Last Vitals:  Vitals:   09/05/19 1300 09/05/19 1315  BP: 109/65   Pulse: 85 90  Resp: (!) 27 (!) 21  Temp:    SpO2: 96% 95%    Last Pain:  Vitals:   09/05/19 1230  TempSrc:   PainSc: 0-No pain                 Lavelle Akel A.

## 2019-09-05 NOTE — Interval H&P Note (Signed)
History and Physical Interval Note:  09/05/2019 8:51 AM  Allison Barker  has presented today for surgery, with the diagnosis of Right hip osteoarthritis.  The various methods of treatment have been discussed with the patient and family. After consideration of risks, benefits and other options for treatment, the patient has consented to  Procedure(s) with comments: TOTAL HIP ARTHROPLASTY ANTERIOR APPROACH (Right) - 70 mins as a surgical intervention.  The patient's history has been reviewed, patient examined, no change in status, stable for surgery.  I have reviewed the patient's chart and labs.  Questions were answered to the patient's satisfaction.     [name]

## 2019-09-05 NOTE — Plan of Care (Signed)
Plan of care for post op day 0 discussed with patient. All questions answered.   Will continue to monitor patient.   SWhittemore, RN 

## 2019-09-05 NOTE — Transfer of Care (Signed)
Immediate Anesthesia Transfer of Care Note  Patient: Allison Barker  Procedure(s) Performed: TOTAL HIP ARTHROPLASTY ANTERIOR APPROACH (Right Hip)  Patient Location: PACU  Anesthesia Type:Spinal  Level of Consciousness: awake, alert  and oriented  Airway & Oxygen Therapy: Patient Spontanous Breathing and Patient connected to face mask oxygen  Post-op Assessment: Report given to RN and Post -op Vital signs reviewed and stable  Post vital signs: Reviewed and stable  Last Vitals:  Vitals Value Taken Time  BP    Temp    Pulse 92 09/05/19 1138  Resp 18 09/05/19 1138  SpO2 96 % 09/05/19 1138  Vitals shown include unvalidated device data.  Last Pain:  Vitals:   09/05/19 0803  TempSrc: Oral      Patients Stated Pain Goal: 4 (09/05/19 0752)  Complications: No complications documented.

## 2019-09-05 NOTE — Evaluation (Signed)
Physical Therapy Evaluation Patient Details Name: Allison Barker MRN: [id number] DOB: [birth year] Today's Date: 09/05/2019   History of Present Illness  pt s/p R DATHA on 09/05/2019 .  Clinical Impression  Pt is s/p R DA  THA resulting in the deficits listed below (see PT Problem List). Pt will benefit from acutePT to increase their independence and safety with mobility to allow discharge home with husband assisting. Pt tolerarted session well today and had some bruing pain in Right anterior hip area. Will continue to educate with HEP and ambulation progression tomorrow and well as steps.      Follow Up Recommendations Follow surgeon's recommendation for DC plan and follow-up therapies    Equipment Recommendations  Rolling walker with 5" wheels (states [employer] aware)    Recommendations for Other Services       Precautions / Restrictions Precautions Precautions: None Restrictions Weight Bearing Restrictions: No      Mobility  Bed Mobility Overal bed mobility: Needs Assistance Bed Mobility: Supine to Sit;Sit to Supine     Supine to sit: Min assist        Transfers Overall transfer level: Needs assistance Equipment used: Rolling walker (2 wheeled) Transfers: Sit to/from Stand Sit to Stand: Min assist         General transfer comment: cues for RW safety as well  Ambulation/Gait Ambulation/Gait assistance: Min assist Gait Distance (Feet): 60 Feet Assistive device: Rolling walker (2 wheeled) Gait Pattern/deviations: Step-to pattern     General Gait Details: tolerated well, educated with step to pattern for first session . Will transition to step through quickly  Stairs            Wheelchair Mobility    Modified Rankin (Stroke Patients Only)       Balance Overall balance assessment: Needs assistance Sitting-balance support: No upper extremity supported Sitting balance-Leahy Scale: Normal     Standing balance support: During functional  activity;Bilateral upper extremity supported Standing balance-Leahy Scale: Good                               Pertinent Vitals/Pain Pain Assessment: 0-10 Pain Score: 6  Pain Location: R anterior hip areas. Pain Descriptors / Indicators: Burning;Sore Pain Intervention(s): Monitored during session;Ice applied    Home Living Family/patient expects to be discharged to:: Private residence   Available Help at Discharge: Family Type of Home: House Home Access: Stairs to enter   Secretary/administrator of Steps: 3 Home Layout: One level Home Equipment: None      Prior Function Level of Independence: Independent               Hand Dominance        Extremity/Trunk Assessment        Lower Extremity Assessment Lower Extremity Assessment: Overall WFL for tasks assessed       Communication   Communication: No difficulties  Cognition Arousal/Alertness: Awake/alert Behavior During Therapy: WFL for tasks assessed/performed Overall Cognitive Status: Within Functional Limits for tasks assessed                                        General Comments      Exercises Total Joint Exercises Ankle Circles/Pumps: AROM;Both;10 reps;Supine Quad Sets: AROM;Right;10 reps;Supine Gluteal Sets: AROM;Supine;10 reps;Both Heel Slides: AAROM;Supine;Right;5 reps Hip ABduction/ADduction: AAROM;Right;Supine;5 reps Straight Leg Raises: AAROM;Supine;Right;5 reps  Assessment/Plan    PT Assessment Patient needs continued PT services  PT Problem List Decreased strength;Decreased activity tolerance;Decreased mobility       PT Treatment Interventions DME instruction;Therapeutic exercise;Gait training;Stair training;Functional mobility training;Therapeutic activities;Patient/family education    PT Goals (Current goals can be found in the Care Plan section)  Acute Rehab PT Goals Patient Stated Goal: I want to be active again and get moving!! PT Goal  Formulation: With patient Time For Goal Achievement: 09/12/19 Potential to Achieve Goals: Good    Frequency 7X/week   Barriers to discharge        Co-evaluation               AM-PAC PT "6 Clicks" Mobility  Outcome Measure Help needed turning from your back to your side while in a flat bed without using bedrails?: A Little Help needed moving from lying on your back to sitting on the side of a flat bed without using bedrails?: A Little Help needed moving to and from a bed to a chair (including a wheelchair)?: A Little Help needed standing up from a chair using your arms (e.g., wheelchair or bedside chair)?: A Little Help needed to walk in hospital room?: A Little Help needed climbing 3-5 steps with a railing? : A Little 6 Click Score: 18    End of Session Equipment Utilized During Treatment: Gait belt Activity Tolerance: Patient tolerated treatment well Patient left: in chair;with call bell/phone within reach;with family/visitor present;with chair alarm set Nurse Communication: Mobility status PT Visit Diagnosis: Other abnormalities of gait and mobility (R26.89)    Time: 1700-1730 PT Time Calculation (min) (ACUTE ONLY): 30 min   Charges:   PT Evaluation $PT Eval Low Complexity: 1 Low PT Treatments $Gait Training: 8-22 mins        Lannette Avellino, PT, MPT Acute Rehabilitation Services Office: [phone] Pager: [phone] 09/05/2019   Clide Dales 09/05/2019, 9:45 PM

## 2019-09-05 NOTE — Anesthesia Procedure Notes (Signed)
Spinal  Patient location during procedure: OR Start time: 09/05/2019 10:00 AM End time: 09/05/2019 10:05 AM Staffing Performed: resident/CRNA  Anesthesiologist: Mal Amabile, MD Resident/CRNA: Marny Lowenstein, CRNA Preanesthetic Checklist Completed: patient identified, IV checked, site marked, risks and benefits discussed, surgical consent, monitors and equipment checked, pre-op evaluation and timeout performed Spinal Block Patient position: sitting Prep: DuraPrep Patient monitoring: heart rate, continuous pulse ox and blood pressure Approach: midline Location: L3-4 Injection technique: single-shot Needle Needle type: Pencan  Needle gauge: 24 G Needle length: 10 cm Assessment Sensory level: T8

## 2019-09-05 NOTE — Care Plan (Addendum)
Ortho Bundle Case Management Note  Patient Details  Name: Allison Barker MRN: [id number] Date of Birth: [birth year]  R THA on 09-05-19 DCP:  Home with husband.  1 story home with 2 ste. DME:  RW ordered through [employer].  Has elevated toilets, doesn't want/need a 3-in-1. PT:  HEP                 DME Arranged:  Rolling Walker DME Agency:  [employer]  HH Arranged:  NA HH Agency:  NA  Additional Comments: Please contact me with any questions of if this plan should need to change.  Ennis Forts, RN,CCM [facility]  [phone] 09/05/2019, 11:39 AM

## 2019-09-06 ENCOUNTER — Encounter (HOSPITAL_COMMUNITY): Payer: Self-pay | Admitting: Orthopedic Surgery

## 2019-09-06 ENCOUNTER — Encounter: Payer: Self-pay | Admitting: *Deleted

## 2019-09-06 DIAGNOSIS — Z7983 Long term (current) use of bisphosphonates: Secondary | ICD-10-CM | POA: Diagnosis not present

## 2019-09-06 DIAGNOSIS — I10 Essential (primary) hypertension: Secondary | ICD-10-CM | POA: Diagnosis not present

## 2019-09-06 DIAGNOSIS — Z8349 Family history of other endocrine, nutritional and metabolic diseases: Secondary | ICD-10-CM | POA: Diagnosis not present

## 2019-09-06 DIAGNOSIS — M8580 Other specified disorders of bone density and structure, unspecified site: Secondary | ICD-10-CM | POA: Diagnosis not present

## 2019-09-06 DIAGNOSIS — E559 Vitamin D deficiency, unspecified: Secondary | ICD-10-CM | POA: Diagnosis not present

## 2019-09-06 DIAGNOSIS — Z7952 Long term (current) use of systemic steroids: Secondary | ICD-10-CM | POA: Diagnosis not present

## 2019-09-06 DIAGNOSIS — Z96641 Presence of right artificial hip joint: Secondary | ICD-10-CM | POA: Diagnosis not present

## 2019-09-06 DIAGNOSIS — Z888 Allergy status to other drugs, medicaments and biological substances status: Secondary | ICD-10-CM | POA: Diagnosis not present

## 2019-09-06 DIAGNOSIS — Z8249 Family history of ischemic heart disease and other diseases of the circulatory system: Secondary | ICD-10-CM | POA: Diagnosis not present

## 2019-09-06 DIAGNOSIS — E663 Overweight: Secondary | ICD-10-CM | POA: Diagnosis present

## 2019-09-06 DIAGNOSIS — M810 Age-related osteoporosis without current pathological fracture: Secondary | ICD-10-CM | POA: Diagnosis not present

## 2019-09-06 DIAGNOSIS — Z7982 Long term (current) use of aspirin: Secondary | ICD-10-CM | POA: Diagnosis not present

## 2019-09-06 DIAGNOSIS — Z79899 Other long term (current) drug therapy: Secondary | ICD-10-CM | POA: Diagnosis not present

## 2019-09-06 DIAGNOSIS — E785 Hyperlipidemia, unspecified: Secondary | ICD-10-CM | POA: Diagnosis not present

## 2019-09-06 DIAGNOSIS — Z833 Family history of diabetes mellitus: Secondary | ICD-10-CM | POA: Diagnosis not present

## 2019-09-06 DIAGNOSIS — M1611 Unilateral primary osteoarthritis, right hip: Secondary | ICD-10-CM | POA: Diagnosis not present

## 2019-09-06 LAB — CBC
HCT: 32.5 % — ABNORMAL LOW (ref 36.0–46.0)
Hemoglobin: 11 g/dL — ABNORMAL LOW (ref 12.0–15.0)
MCH: 33 pg (ref 26.0–34.0)
MCHC: 33.8 g/dL (ref 30.0–36.0)
MCV: 97.6 fL (ref 80.0–100.0)
Platelets: 238 10*3/uL (ref 150–400)
RBC: 3.33 MIL/uL — ABNORMAL LOW (ref 3.87–5.11)
RDW: 12.2 % (ref 11.5–15.5)
WBC: 10.5 10*3/uL (ref 4.0–10.5)
nRBC: 0 % (ref 0.0–0.2)

## 2019-09-06 LAB — BASIC METABOLIC PANEL
Anion gap: 10 (ref 5–15)
BUN: 12 mg/dL (ref 8–23)
CO2: 23 mmol/L (ref 22–32)
Calcium: 7.8 mg/dL — ABNORMAL LOW (ref 8.9–10.3)
Chloride: 101 mmol/L (ref 98–111)
Creatinine, Ser: 0.81 mg/dL (ref 0.44–1.00)
GFR calc Af Amer: >60 mL/min (ref >60)
GFR calc non Af Amer: >60 mL/min (ref >60)
Glucose, Bld: 172 mg/dL — ABNORMAL HIGH (ref 70–99)
Potassium: 3.6 mmol/L (ref 3.5–5.1)
Sodium: 134 mmol/L — ABNORMAL LOW (ref 135–145)

## 2019-09-06 MED ORDER — POTASSIUM CHLORIDE CRYS ER 20 MEQ PO TBCR
20.0000 meq | EXTENDED_RELEASE_TABLET | Freq: Once | ORAL | Status: AC
  Administered 2019-09-06: 20 meq via ORAL
  Filled 2019-09-06: qty 1

## 2019-09-06 NOTE — Discharge Summary (Signed)
Patient ID: Allison Barker MRN: [id number] DOB/AGE: 1945-07-08 74 y.o.  Admit date: 09/05/2019 Discharge date: 09/06/2019  Admission Diagnoses:  Principal Problem:   Right hip OA Active Problems:   Status post right hip replacement   Overweight (BMI 25.0-29.9)   Discharge Diagnoses:  Same  Past Medical History:  Diagnosis Date  . 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 Allergy   . Anxiety   . Arthritis   . Depression   . Hyperlipidemia   . Hypertension   . Osteopenia   . Prediabetes   . Tuberculosis    false +  . Vitamin D deficiency     Surgeries: Procedure(s): RIGHT TOTAL HIP ARTHROPLASTY ANTERIOR APPROACH on 09/05/2019   Consultants: N/A  Discharged Condition: Improved  Hospital Course: Allison Barker is an 74 y.o. female who was admitted 09/05/2019 for operative treatment ofOsteoarthritis of right hip. Patient has severe unremitting pain that affects sleep, daily activities, and work/hobbies. After pre-op clearance the patient was taken to the operating room on 09/05/2019 and underwent  Procedure(s): RIGHT TOTAL HIP ARTHROPLASTY ANTERIOR APPROACH.    Patient was given perioperative antibiotics:  Anti-infectives (From admission, onward)   Start     Dose/Rate Route Frequency Ordered Stop   09/05/19 1630  valACYclovir (VALTREX) tablet 500 mg     Discontinue     500 mg Oral Daily 09/05/19 1619     09/05/19 1630  ceFAZolin (ANCEF) IVPB 2g/100 mL premix        2 g 200 mL/hr over 30 Minutes Intravenous Every 6 hours 09/05/19 1619 09/05/19 2233   09/05/19 0745  ceFAZolin (ANCEF) IVPB 2g/100 mL premix        2 g 200 mL/hr over 30 Minutes Intravenous On call to O.R. 09/05/19 0731 09/05/19 1047       Patient was given sequential compression devices, early ambulation, and chemoprophylaxis to prevent DVT.  Patient benefited maximally from hospital stay and there were no complications.    Recent vital signs:  Patient Vitals for the past 24 hrs:  BP Temp Temp  src Pulse Resp SpO2  09/06/19 0539 117/71 97.7 F (36.5 C) Oral 72 -- 96 %  09/06/19 0149 118/66 98.3 F (36.8 C) Oral 71 -- 96 %  09/05/19 2132 123/62 97.7 F (36.5 C) Oral 83 -- 95 %  09/05/19 1753 130/72 97.8 F (36.6 C) Oral 92 16 94 %  09/05/19 1642 115/74 97.8 F (36.6 C) Oral 98 16 95 %  09/05/19 1539 124/71 97.9 F (36.6 C) Oral 95 16 98 %  09/05/19 1500 122/60 -- -- (!) 102 (!) 24 98 %  09/05/19 1400 115/63 -- -- 82 17 94 %  09/05/19 1315 128/65 -- -- 90 (!) 21 95 %  09/05/19 1300 109/65 -- -- 85 (!) 27 96 %  09/05/19 1245 102/65 -- -- 65 15 96 %  09/05/19 1230 104/62 98 F (36.7 C) -- 67 14 98 %  09/05/19 1215 96/68 -- -- 74 18 96 %  09/05/19 1200 97/68 -- -- 90 16 96 %  09/05/19 1145 (!) 106/57 -- -- 95 17 94 %  09/05/19 1137 100/60 97.9 F (36.6 C) -- 92 18 96 %     Recent laboratory studies:  Recent Labs    09/06/19 0252  WBC 10.5  HGB 11.0*  HCT 32.5*  PLT 238  NA 134*  K 3.6  CL 101  CO2 23  BUN 12  CREATININE 0.81  GLUCOSE 172*  CALCIUM 7.8*     Discharge Medications:   Allergies as of 09/06/2019      Reactions   Ppd [tuberculin Purified Protein Derivative]       Medication List    STOP taking these medications   acyclovir 400 MG tablet Commonly known as: ZOVIRAX   alendronate 70 MG tablet Commonly known as: Fosamax   aspirin EC 81 MG tablet Replaced by: aspirin 81 MG chewable tablet   triamcinolone ointment 0.1 % Commonly known as: KENALOG     TAKE these medications   ALPRAZolam 0.5 MG tablet Commonly known as: XANAX TAKE 1/2-1 TABLET BY MOUTH DAILY AT BEDTIME ONLY IF NEEDED FOR INSOMNIA. AVOID TAKING DAILY DUE TO RISK OF ADDICTION.   aspirin 81 MG chewable tablet Commonly known as: Aspirin Childrens Chew 1 tablet (81 mg total) by mouth 2 (two) times daily. Take for 4 weeks, then resume regular dose. Replaces: aspirin EC 81 MG tablet   bisoprolol-hydrochlorothiazide 5-6.25 MG tablet Commonly known as: ZIAC TAKE ONE TABLET  BY MOUTH EVERY DAY FOR BLOOD PRESSURE What changed:   how much to take  how to take this  when to take this  additional instructions   cetirizine 10 MG tablet Commonly known as: ZYRTEC Take 10 mg by mouth daily as needed (allergies.).   CoQ10 100 MG Caps Take 100 mg by mouth 2 (two) times a week.   docusate sodium 100 MG capsule Commonly known as: Colace Take 1 capsule (100 mg total) by mouth 2 (two) times daily.   escitalopram 20 MG tablet Commonly known as: LEXAPRO Take 1 tablet Daily for Mood What changed:   how much to take  how to take this  when to take this  additional instructions   ferrous sulfate 325 (65 FE) MG tablet Commonly known as: FerrouSul Take 1 tablet (325 mg total) by mouth 3 (three) times daily with meals for 14 days.   furosemide 40 MG tablet Commonly known as: LASIX Take 1 tablet Daily for BP / Fluid Retention / Ankle Swelling What changed:   how much to take  how to take this  when to take this  additional instructions   HYDROcodone-acetaminophen 7.5-325 MG tablet Commonly known as: Norco Take 1-2 tablets by mouth every 4 (four) hours as needed for moderate pain.   methocarbamol 500 MG tablet Commonly known as: Robaxin Take 1 tablet (500 mg total) by mouth every 6 (six) hours as needed for muscle spasms.   MULTIVITAMINS PO Take 1 packet by mouth daily. Vitapak for Women by [employer]   polyethylene glycol 17 g packet Commonly known as: MIRALAX / GLYCOLAX Take 17 g by mouth 2 (two) times daily.   pravastatin 40 MG tablet Commonly known as: PRAVACHOL Take 1 tablet at Bedtime for Cholesterol What changed:   how much to take  how to take this  when to take this  additional instructions   prednisoLONE acetate 1 % ophthalmic suspension Commonly known as: PRED FORTE Place 1 drop into the left eye daily.   sulfamethoxazole-trimethoprim 800-160 MG tablet Commonly known as: BACTRIM DS Take 1 tablet by mouth 2 (two) times  daily.   TURMERIC PO Take 1,000 mg by mouth 3 (three) times a week.   valACYclovir 500 MG tablet Commonly known as: VALTREX Take 500 mg by mouth daily.   VITAMIN C PO Take 1 tablet by mouth 3 (three) times a week.   Vitamin D3 50 MCG (2000 UT) Tabs Take 2,000 Units  by mouth daily.            Discharge Care Instructions  (From admission, onward)         Start     Ordered   09/05/19 0000  Change dressing       Comments: Maintain surgical dressing until follow up in the clinic. If the edges start to pull up, may reinforce with tape. If the dressing is no longer working, may remove and cover with gauze and tape, but must keep the area dry and clean.  Call with any questions or concerns.   09/05/19 0840          Diagnostic Studies: DG Pelvis Portable  Result Date: 09/05/2019 CLINICAL DATA:  Right hip replacement EXAM: PORTABLE PELVIS 1-2 VIEWS COMPARISON:  None. FINDINGS: Interval right total hip arthroplasty without hardware failure or complication. Postsurgical changes in the surrounding soft tissues. No acute fracture or dislocation. IMPRESSION: Interval right total hip arthroplasty. Electronically Signed   By: Elige Ko   On: 09/05/2019 12:44   DG C-Arm 1-60 Min-No Report  Result Date: 09/05/2019 Fluoroscopy was utilized by the requesting physician.  No radiographic interpretation.   DG HIP OPERATIVE UNILAT W OR W/O PELVIS RIGHT  Result Date: 09/05/2019 CLINICAL DATA:  Right hip replacement EXAM: OPERATIVE RIGHT HIP WITH PELVIS COMPARISON:  None. FLUOROSCOPY TIME:  Radiation Exposure Index (as provided by the fluoroscopic device): 1.29 mGy If the device does not provide the exposure index: Fluoroscopy Time:  10 Number of Acquired Images:  2 FINDINGS: Right hip replacement is noted in satisfactory position. No acute bony or soft tissue abnormality is seen. IMPRESSION: Status post right hip replacement Electronically Signed   By: Alcide Clever M.D.   On: 09/05/2019 14:50     Disposition: Discharge disposition: 01-Home or Self Care       Discharge Instructions    Call MD / Call 911   Complete by: As directed    If you experience chest pain or shortness of breath, CALL 911 and be transported to the hospital emergency room.  If you develope a fever above 101 F, pus (white drainage) or increased drainage or redness at the wound, or calf pain, call your surgeon's office.   Change dressing   Complete by: As directed    Maintain surgical dressing until follow up in the clinic. If the edges start to pull up, may reinforce with tape. If the dressing is no longer working, may remove and cover with gauze and tape, but must keep the area dry and clean.  Call with any questions or concerns.   Constipation Prevention   Complete by: As directed    Drink plenty of fluids.  Prune juice may be helpful.  You may use a stool softener, such as Colace (over the counter) 100 mg twice a day.  Use MiraLax (over the counter) for constipation as needed.   Diet - low sodium heart healthy   Complete by: As directed    Discharge instructions   Complete by: As directed    Maintain surgical dressing until follow up in the clinic. If the edges start to pull up, may reinforce with tape. If the dressing is no longer working, may remove and cover with gauze and tape, but must keep the area dry and clean.  Follow up [facility]. Call with any questions or concerns.   Increase activity slowly as tolerated   Complete by: As directed    Weight bearing as  tolerated with assist device (walker, cane, etc) as directed, use it as long as suggested by your surgeon or therapist, typically at least 4-6 weeks.   TED hose   Complete by: As directed    Use stockings (TED hose) for 2 weeks on both leg(s).  You may remove them at night for sleeping.       Follow-up Information    Norman Herrlich. Go on 09/20/2019.   Specialty: Orthopedic Surgery Why: You are scheduled for a  post-operative appointment on 09-20-19 at 2:45 pm.  Contact information: [address] [place] [address] [phone]                Signed: Lucille Passy [facility] 09/06/2019, 8:19 AM

## 2019-09-06 NOTE — Plan of Care (Signed)
°  Problem: Education: °Goal: Knowledge of the prescribed therapeutic regimen will improve °Outcome: Progressing °Goal: Understanding of discharge needs will improve °Outcome: Progressing °Goal: Individualized Educational Video(s) °Outcome: Progressing °  °Problem: Pain Management: °Goal: Pain level will decrease with appropriate interventions °Outcome: Progressing °  °Problem: Skin Integrity: °Goal: Will show signs of wound healing °Outcome: Progressing °  °

## 2019-09-06 NOTE — Progress Notes (Signed)
     Subjective: 1 Day Post-Op Procedure(s) (LRB): TOTAL HIP ARTHROPLASTY ANTERIOR APPROACH (Right)   Patient reports pain as mild, pain controlled.  No reported events throughout the night.  Dr. Charlann Boxer discussed the procedure, findings and expectations moving forward.  Ready to be discharged home, if they do well with PT.  Follow up in the clinic in 2 weeks.  Knows to call with any questions or concerns.       Objective:   VITALS:   Vitals:   09/06/19 0149 09/06/19 0539  BP: 118/66 117/71  Pulse: 71 72  Resp:    Temp: 98.3 F (36.8 C) 97.7 F (36.5 C)  SpO2: 96% 96%    Dorsiflexion/Plantar flexion intact Incision: dressing C/D/I No cellulitis present Compartment soft  LABS Recent Labs    09/06/19 0252  HGB 11.0*  HCT 32.5*  WBC 10.5  PLT 238    Recent Labs    09/06/19 0252  NA 134*  K 3.6  BUN 12  CREATININE 0.81  GLUCOSE 172*     Assessment/Plan: 1 Day Post-Op Procedure(s) (LRB): TOTAL HIP ARTHROPLASTY ANTERIOR APPROACH (Right) Foley cath d/c'ed Advance diet Up with therapy D/C IV fluids Discharge home Follow up in 2 weeks at [facility] Follow up with OLIN,Monque Haggar D in 2 weeks.  Contact information:  [facility] [address], [address] [place] [address] [phone]    Overweight (BMI 25-29.9) Estimated body mass index is 29.63 kg/m as calculated from the following:   Height as of this encounter: 5\' 4"  (1.626 m).   Weight as of this encounter: 78.3 kg. Patient also counseled that weight may inhibit the healing process Patient counseled that losing weight will help with future health issues       PA-C  [facility]  [place] [address], [address], [place], [place] Phone: [phone] [email or web address] Facebook  [phone]

## 2019-09-06 NOTE — TOC Transition Note (Signed)
Transition of Care [facility]) - CM/SW Discharge Note   Patient Details  Name: Allison Barker MRN: [id number] Date of Birth: 20-Sep-1945  [facility] CM/SW Contact:  Clearance Coots, LCSW Phone Number: 09/06/2019, 11:44 AM   Clinical Narrative:    Elmon Else Plan of Discharge, see CM note.          Patient Goals and CMS Choice        Discharge Placement                       Discharge Plan and Services                DME Arranged: Walker rolling DME Agency: [employer]       HH Arranged: NA HH Agency: NA        Social Determinants of Health (SDOH) Interventions     Readmission Risk Interventions No flowsheet data found.

## 2019-09-06 NOTE — Plan of Care (Signed)
resolved 

## 2019-09-06 NOTE — Progress Notes (Signed)
Physical Therapy Treatment Patient Details Name: Allison Barker MRN: [id number] DOB: 1945-08-19 Today's Date: 09/06/2019    History of Present Illness pt s/p R DATHA on 09/05/2019 .    PT Comments    Pt progressing well. Ready for d/c with family assist.   Follow Up Recommendations  Follow surgeon's recommendation for DC plan and follow-up therapies     Equipment Recommendations       Recommendations for Other Services       Precautions / Restrictions Restrictions Weight Bearing Restrictions: No Other Position/Activity Restrictions: WBAT    Mobility  Bed Mobility               General bed mobility comments: pt in chair   Transfers Overall transfer level: Needs assistance Equipment used: Rolling walker (2 wheeled) Transfers: Sit to/from Stand Sit to Stand: Supervision         General transfer comment: cues for hand placement and to keep RW close  Ambulation/Gait Ambulation/Gait assistance: Min guard;Supervision Gait Distance (Feet): 80 Feet Assistive device: Rolling walker (2 wheeled) Gait Pattern/deviations: Step-to pattern;Decreased stance time - right     General Gait Details: cues for sequence and  RW safety    Stairs Stairs: Yes Stairs assistance: Min guard;Min assist Stair Management: Step to pattern;Forwards Number of Stairs: 3 General stair comments: cues for safety and sequence    Wheelchair Mobility    Modified Rankin (Stroke Patients Only)       Balance                                            Cognition Arousal/Alertness: Awake/alert Behavior During Therapy: WFL for tasks assessed/performed Overall Cognitive Status: Within Functional Limits for tasks assessed                                 General Comments: questionable STM deficits, repetition needed       Exercises Total Joint Exercises Ankle Circles/Pumps: AROM;Both;10 reps Quad Sets: AROM;Right;10 reps Short Arc Quad:  AROM;Right;10 reps Heel Slides: AROM;AAROM;10 reps;Right Hip ABduction/ADduction: AROM;Right;10 reps Long Arc Quad: AROM;Right;10 reps;Seated    General Comments        Pertinent Vitals/Pain Pain Assessment: 0-10 Pain Score: 2  Pain Location: R hip Pain Descriptors / Indicators: Burning;Sore Pain Intervention(s): Limited activity within patient's tolerance;Monitored during session;Premedicated before session;Repositioned;Ice applied    Home Living                      Prior Function            PT Goals (current goals can now be found in the care plan section) Acute Rehab PT Goals Patient Stated Goal: I want to be active again and get moving!! PT Goal Formulation: With patient Time For Goal Achievement: 09/12/19 Potential to Achieve Goals: Good Progress towards PT goals: Progressing toward goals    Frequency    7X/week      PT Plan Current plan remains appropriate    Co-evaluation              AM-PAC PT "6 Clicks" Mobility   Outcome Measure  Help needed turning from your back to your side while in a flat bed without using bedrails?: A Little Help needed moving from lying on your back to sitting on the  side of a flat bed without using bedrails?: A Little Help needed moving to and from a bed to a chair (including a wheelchair)?: A Little Help needed standing up from a chair using your arms (e.g., wheelchair or bedside chair)?: A Little Help needed to walk in hospital room?: A Little Help needed climbing 3-5 steps with a railing? : A Little 6 Click Score: 18    End of Session Equipment Utilized During Treatment: Gait belt Activity Tolerance: Patient tolerated treatment well Patient left: in chair;with call bell/phone within reach;with chair alarm set Nurse Communication: Mobility status PT Visit Diagnosis: Other abnormalities of gait and mobility (R26.89)     Time: 1050-1110 PT Time Calculation (min) (ACUTE ONLY): 20 min  Charges:  $Gait  Training: 8-22 mins                     Delice Bison, PT  Acute Rehab Dept ([facility]) [phone] Pager [phone]  09/06/2019    [facility] 09/06/2019, 11:22 AM

## 2019-09-28 ENCOUNTER — Other Ambulatory Visit: Payer: Self-pay | Admitting: Internal Medicine

## 2019-10-18 ENCOUNTER — Other Ambulatory Visit: Payer: Self-pay | Admitting: Adult Health

## 2019-10-25 DIAGNOSIS — Z96641 Presence of right artificial hip joint: Secondary | ICD-10-CM | POA: Diagnosis not present

## 2019-10-25 DIAGNOSIS — Z471 Aftercare following joint replacement surgery: Secondary | ICD-10-CM | POA: Diagnosis not present

## 2019-11-09 DIAGNOSIS — Z961 Presence of intraocular lens: Secondary | ICD-10-CM | POA: Diagnosis not present

## 2019-11-09 DIAGNOSIS — H16302 Unspecified interstitial keratitis, left eye: Secondary | ICD-10-CM | POA: Diagnosis not present

## 2019-11-09 DIAGNOSIS — H17822 Peripheral opacity of cornea, left eye: Secondary | ICD-10-CM | POA: Diagnosis not present

## 2019-11-09 DIAGNOSIS — H43811 Vitreous degeneration, right eye: Secondary | ICD-10-CM | POA: Diagnosis not present

## 2019-11-09 DIAGNOSIS — H04123 Dry eye syndrome of bilateral lacrimal glands: Secondary | ICD-10-CM | POA: Diagnosis not present

## 2019-11-16 ENCOUNTER — Other Ambulatory Visit: Payer: Self-pay | Admitting: Physician Assistant

## 2019-11-16 DIAGNOSIS — F411 Generalized anxiety disorder: Secondary | ICD-10-CM

## 2019-11-23 DIAGNOSIS — H17822 Peripheral opacity of cornea, left eye: Secondary | ICD-10-CM | POA: Diagnosis not present

## 2019-11-23 DIAGNOSIS — H0102A Squamous blepharitis right eye, upper and lower eyelids: Secondary | ICD-10-CM | POA: Diagnosis not present

## 2019-11-23 DIAGNOSIS — Z961 Presence of intraocular lens: Secondary | ICD-10-CM | POA: Diagnosis not present

## 2019-11-23 DIAGNOSIS — H0102B Squamous blepharitis left eye, upper and lower eyelids: Secondary | ICD-10-CM | POA: Diagnosis not present

## 2019-11-30 ENCOUNTER — Other Ambulatory Visit: Payer: Self-pay | Admitting: Internal Medicine

## 2019-12-18 DIAGNOSIS — M79671 Pain in right foot: Secondary | ICD-10-CM | POA: Diagnosis not present

## 2019-12-18 DIAGNOSIS — M79672 Pain in left foot: Secondary | ICD-10-CM | POA: Diagnosis not present

## 2019-12-18 DIAGNOSIS — B351 Tinea unguium: Secondary | ICD-10-CM | POA: Diagnosis not present

## 2019-12-18 DIAGNOSIS — M2012 Hallux valgus (acquired), left foot: Secondary | ICD-10-CM | POA: Diagnosis not present

## 2019-12-18 DIAGNOSIS — M21611 Bunion of right foot: Secondary | ICD-10-CM | POA: Diagnosis not present

## 2019-12-27 DIAGNOSIS — B351 Tinea unguium: Secondary | ICD-10-CM | POA: Diagnosis not present

## 2020-01-10 DIAGNOSIS — M79672 Pain in left foot: Secondary | ICD-10-CM | POA: Diagnosis not present

## 2020-01-11 ENCOUNTER — Ambulatory Visit: Payer: Medicare Other | Admitting: Adult Health Nurse Practitioner

## 2020-01-11 ENCOUNTER — Other Ambulatory Visit: Payer: Self-pay | Admitting: Adult Health

## 2020-01-11 DIAGNOSIS — H16302 Unspecified interstitial keratitis, left eye: Secondary | ICD-10-CM | POA: Diagnosis not present

## 2020-01-11 DIAGNOSIS — H43811 Vitreous degeneration, right eye: Secondary | ICD-10-CM | POA: Diagnosis not present

## 2020-01-11 DIAGNOSIS — H0102B Squamous blepharitis left eye, upper and lower eyelids: Secondary | ICD-10-CM | POA: Diagnosis not present

## 2020-01-11 DIAGNOSIS — H0102A Squamous blepharitis right eye, upper and lower eyelids: Secondary | ICD-10-CM | POA: Diagnosis not present

## 2020-01-11 DIAGNOSIS — F411 Generalized anxiety disorder: Secondary | ICD-10-CM

## 2020-01-17 DIAGNOSIS — D649 Anemia, unspecified: Secondary | ICD-10-CM | POA: Insufficient documentation

## 2020-01-17 NOTE — Progress Notes (Signed)
CPE and follow up  Assessment:   Essential hypertension - continue medications, DASH diet, exercise and monitor at home. Call if greater than 130/80.  -     CBC with Differential/Platelet -     CMP/GFR -     TSH -     EKG - declines as just had in June  -     UA/microalbumin   Mixed hyperlipidemia -continue medications, check lipids, decrease fatty foods, increase activity.  -     Lipid panel  Prediabetes Discussed general issues about diabetes pathophysiology and management., Educational material distributed., Suggested low cholesterol diet., Encouraged aerobic exercise., Discussed foot care., Reminded to get yearly retinal exam. -     Hemoglobin A1c  Vitamin D deficiency Continue supplement  Medication management -     Magnesium  Recurrent major depressive disorder, in partial remission (HCC) - continue medications, working to reduce xanax use, stress management techniques discussed, increase water, good sleep hygiene discussed, increase exercise, and increase veggies.   Recurrent UTI Monitor, push fluids, hygiene reviewed  Obesity - long discussion about weight loss, diet, and exercise - weight loss goal <160 lb next year  Ocular herpes zoster Continue follow up eye doctor, continue antivirals  S/p R THA Doing well after surgery, Dr. Charlann Boxer following Was advised dental abx prophylaxis, per request filled amoxicillin 2g for upcoming dental, she will follow up with Dr. Charlann Boxer and clarify ongoing recommendations  Anemia - CBC, B12, add iron reflex if indicated  Osteoporosis, unspecified osteoporosis type, unspecified pathological fracture presence - get dexa with next mammogram, order placed, continue Vit D and Ca, weight bearing exercises -     alendronate (FOSAMAX) 70 MG tablet; Take 1 tablet (70 mg total) by mouth once a week.   Over 30 minutes of exam, counseling, chart review and critical decision making was performed Future Appointments  Date Time Provider  Department Center  05/21/2020  4:00 PM Lucky Cowboy, MD [facility] None  07/15/2020  2:00 PM Judd Gaudier, NP [facility] None  01/20/2021  3:00 PM Judd Gaudier, NP [facility] None     Subjective:  Allison Barker is a 74 y.o. female who presents for CPE and follow up. She has Hypertension; Hyperlipidemia; Vitamin D deficiency; Abnormal glucose; Recurrent UTI; Medication management; Major depression, recurrent, in partial remission  (HCC); Osteoporosis; Status post right hip replacement; Overweight (BMI 25.0-29.9); and Anemia on their problem list.   She is married, 1 grown son, 2 grandchildren.  [place]  She had R hip THA by Dr. Charlann Boxer in June 2021. She has done well since. She does report he advised her to take prophylactic abx prior to upcoming dental procedure, unsure why, can't seem to get ahold of him to get script and requesting we send this in, will get future through ortho.  She has ocular herpes, on acyclovir for this and follows Dr. Noel Gerold every 6 months.   She has major/depression in partial remission on lexapro 20 mg daily, takes unisol for sleep, PRN xanax for anxiety, no longer taking nightly, trying to reduce use.    Her blood pressure has been controlled at home, today their BP is BP: 110/70   BMI is Body mass index is 30.11 kg/m., she is working on diet, eating smaller portions.  Wt Readings from Last 3 Encounters:  01/18/20 170 lb (77.1 kg)  09/05/19 172 lb 9.6 oz (78.3 kg)  09/01/19 172 lb 9.6 oz (78.3 kg)   She does not workout but planning  to start walking a mile daily, had stopped due to foot pain but improved. She denies chest pain, shortness of breath, dizziness.   She is on cholesterol medication and denies myalgias. Her cholesterol is at goal. The cholesterol last visit was:   Lab Results  Component Value Date   CHOL 172 07/13/2019   HDL 54 07/13/2019   LDLCALC 82 07/13/2019   TRIG 276 (H) 07/13/2019   CHOLHDL 3.2 07/13/2019    She has intermittent prediabetes for many years; is working on weight loss, reducing sweets.  Lab Results  Component Value Date   HGBA1C 6.0 (H) 07/13/2019   Last GFR: Lab Results  Component Value Date   GFRNONAA >60 09/06/2019   Patient is on Vitamin D supplement.   Lab Results  Component Value Date   VD25OH 83 07/13/2019      Lab Results  Component Value Date   WBC 10.5 09/06/2019   HGB 11.0 (L) 09/06/2019   HCT 32.5 (L) 09/06/2019   MCV 97.6 09/06/2019   PLT 238 09/06/2019   No results found for: IRON, TIBC, FERRITIN  She is taking unknown B12 supplement for several years No results found for: VITAMINB12    Medication Review:  Current Outpatient Medications (Endocrine & Metabolic):  .  alendronate (FOSAMAX) 70 MG tablet, Take 1 tablet (70 mg total) by mouth once a week.  Current Outpatient Medications (Cardiovascular):  .  bisoprolol-hydrochlorothiazide (ZIAC) 5-6.25 MG tablet, TAKE 1 TABLET BY MOUTH EVERY DAY FOR BLOOD PRESSURE .  furosemide (LASIX) 40 MG tablet, Take 1 tablet Daily for BP and Fluid Retention / Ankle Swelling .  pravastatin (PRAVACHOL) 40 MG tablet, Take 1 tablet at Bedtime for Cholesterol (Patient taking differently: Take 40 mg by mouth at bedtime. )  Current Outpatient Medications (Respiratory):  .  cetirizine (ZYRTEC) 10 MG tablet, Take 10 mg by mouth daily as needed (allergies.).   Current Outpatient Medications (Analgesics):  .  HYDROcodone-acetaminophen (NORCO) 7.5-325 MG tablet, Take 1-2 tablets by mouth every 4 (four) hours as needed for moderate pain.  Current Outpatient Medications (Hematological):  .  ferrous sulfate (FERROUSUL) 325 (65 FE) MG tablet, Take 1 tablet (325 mg total) by mouth 3 (three) times daily with meals for 14 days.  Current Outpatient Medications (Other):  Marland Kitchen  ALPRAZolam (XANAX) 0.5 MG tablet, Take     1/2 - 1 tablet       at Bedtime       ONLY     if needed for   Sleep  &  limit to 5 days /week to avoid  Addiction & Dementia .  Ascorbic Acid (VITAMIN C PO), Take 1 tablet by mouth daily.  .  Cholecalciferol (VITAMIN D3) 50 MCG (2000 UT) TABS, Take 2,000 Units by mouth daily. .  Coenzyme Q10 (COQ10) 100 MG CAPS, Take 100 mg by mouth daily.  Marland Kitchen  docusate sodium (COLACE) 100 MG capsule, Take 1 capsule (100 mg total) by mouth 2 (two) times daily. Marland Kitchen  escitalopram (LEXAPRO) 20 MG tablet, Take    1 tablet     Daily      for Mood .  Multiple Vitamin (MULTIVITAMINS PO), Take 1 packet by mouth daily. [place] [facility] by [facility] .  prednisoLONE acetate (PRED FORTE) 1 % ophthalmic suspension, Place 1 drop into the left eye daily.  .  TURMERIC PO, Take 1,000 mg by mouth daily.  .  valACYclovir (VALTREX) 500 MG tablet, Take 500 mg by mouth daily.  Marland Kitchen  amoxicillin (  AMOXIL) 500 MG tablet, Take 4 tabs with food 30-60 min prior to dental procedure.   Allergies  Allergen Reactions  . Ppd [Tuberculin Purified Protein Derivative]     Current Problems (verified) Patient Active Problem List   Diagnosis Date Noted  . Anemia 01/17/2020  . Overweight (BMI 25.0-29.9) 09/06/2019  . Status post right hip replacement 09/05/2019  . Osteoporosis 04/11/2018  . Major depression, recurrent, in partial remission  (HCC) 01/28/2015  . Medication management 10/03/2014  . Recurrent UTI 03/21/2014  . Hypertension   . Hyperlipidemia   . Vitamin D deficiency   . Abnormal glucose     Screening Tests Immunization History  Administered Date(s) Administered  . DT (Pediatric) 01/08/2015  . Hepatitis B 03/16/2001, 09/22/2001  . Influenza, High Dose Seasonal PF 11/13/2013, 01/08/2015, 12/25/2015, 01/13/2017, 12/03/2017, 12/01/2018, 12/29/2019  . Influenza,inj,quad, With Preservative 12/14/2017  . Moderna SARS-COVID-2 Vaccination 12/03/2019, 01/02/2020  . Pneumococcal Conjugate-13 04/12/2014, 07/23/2016  . Pneumococcal Polysaccharide-23 12/25/2010  . Td 06/17/2004  . Zoster 07/08/2010   Preventative care: Tetanus:  01/08/15 Pneumovax:12/25/2010 Prevnar 13: 2014 at [facility] Flu vaccine: 2021 Zostavax:07/08/10 Shingrix: reports [facility] [place] Covid: 2/2, 2021, moderna   PAP: never abnormal, done  MGM: 08/2019 DEXA: 03/2018 left femoral neck T -2.9 on fosamax x 03/2018- getting back on it, was off after surgery per ortho  Colonoscopy: 10/06/13 due 8 years, plans to get in [place] when due  CXR 2013  Names of Other Physician/Practitioners you currently use: 1. [facility] here for primary care 2. Cresenciano GenreSandy Cohen, eye doctor, last visit 01/11/2020 3. Dr. Jake ChurchBurnton, dentist, last visit 06/2019, was advised needs abx prophylaxis prior to next OV   Patient Care Team: Lucky CowboyMcKeown, William, MD as PCP - General (Internal Medicine) Vida RiggerMagod, Marc, MD as Consulting Physician (Gastroenterology) Elpidio Galeaohen, Sandya, MD as Consulting Physician (Ophthalmology) Sallye LatGroat, Christopher, MD as Consulting Physician (Optometry) Arminda ResidesJones, Daniel, MD as Consulting Physician (Dermatology) Durene Romanslin, Matthew, MD as Consulting Physician (Orthopedic Surgery)  SURGICAL HISTORY She  has a past surgical history that includes Tonsillectomy and adenoidectomy; Eye surgery (Left, 11/2012); Dilation and curettage of uterus; and Total hip arthroplasty (Right, 09/05/2019). FAMILY HISTORY Her family history includes AAA (abdominal aortic aneurysm) in her mother; Diabetes in her father and paternal grandmother; Heart disease in her mother; Hyperlipidemia in her mother; Hypertension in her father. SOCIAL HISTORY She  reports that she has never smoked. She has never used smokeless tobacco. She reports current alcohol use. She reports that she does not use drugs.  Depression/mood screen:   Depression screen [facility] 2/9 01/18/2020  Decreased Interest 0  Down, Depressed, Hopeless 0  PHQ - 2 Score 0  Altered sleeping 0  Tired, decreased energy 0  Change in appetite 0  Feeling bad or failure about  yourself  0  Trouble concentrating 0  Moving slowly or fidgety/restless 0  Suicidal thoughts 0  PHQ-9 Score 0  Difficult doing work/chores Not difficult at all      Review of Systems  Constitutional: Negative for chills, fever, malaise/fatigue and weight loss.  HENT: Negative for congestion, ear pain, hearing loss, sore throat and tinnitus.   Eyes: Negative.  Negative for blurred vision and double vision.  Respiratory: Negative for cough, sputum production, shortness of breath and wheezing.   Cardiovascular: Negative for chest pain, palpitations, orthopnea, claudication, leg swelling and PND.  Gastrointestinal: Negative for abdominal pain, blood in stool, constipation, diarrhea, heartburn, melena, nausea and vomiting.  Genitourinary: Negative.  Musculoskeletal: Negative for falls, joint pain and myalgias.  Skin: Negative.  Negative for rash.  Neurological: Negative for dizziness, tingling, sensory change, loss of consciousness, weakness and headaches.  Endo/Heme/Allergies: Negative for polydipsia.  Psychiatric/Behavioral: Negative.  Negative for depression, memory loss, substance abuse and suicidal ideas. The patient is not nervous/anxious and does not have insomnia.   All other systems reviewed and are negative.     Objective:     Today's Vitals   01/18/20 1054  BP: 110/70  Pulse: 63  Temp: (!) 96.6 F (35.9 C)  SpO2: 95%  Weight: 170 lb (77.1 kg)  Height: 5\' 3"  (1.6 m)   Body mass index is 30.11 kg/m.  General appearance: alert, no distress, WD/WN, female HEENT: mildly drooping left eye lid, left pupil non-reactive (baseline per patient, mildly cloudy) normocephalic, sclerae anicteric, TMs pearly, nares patent, no discharge or erythema, pharynx normal Oral cavity: MMM, no lesions Neck: supple, no lymphadenopathy, no thyromegaly, no masses Heart: RRR, normal S1, S2, no murmurs Lungs: CTA bilaterally, no wheezes, rhonchi, or rales Abdomen: +bs, soft, non tender, non  distended, no masses, no hepatomegaly, no splenomegaly Musculoskeletal: nontender, no swelling, no obvious deformity.  Extremities: no edema, no cyanosis, no clubbing, no warmth, erythema Pulses: 2+ symmetric, upper and lower extremities, normal cap refill Neurological: alert, oriented x 3, CN2-12 intact (excepting left eye), strength normal upper extremities and lower extremities, sensation normal throughout, DTRs 2+ throughout, no cerebellar signs, gait normal Psychiatric: normal affect, behavior normal, pleasant  Breasts: declines GU: declines Derm: warm/dry intact without concerning lesions, ecchymosis. She has mild dry flaky/scaly area to antecubital; reports improved. Well healed surgical scar R hip.   EKG: declines, recent WNL in 08/2019 prior to surgey  AAA: normal 09/2019 screen 05/2018 reviewed    06/2018, NP   01/18/2020

## 2020-01-18 ENCOUNTER — Other Ambulatory Visit: Payer: Self-pay

## 2020-01-18 ENCOUNTER — Encounter: Payer: Self-pay | Admitting: Adult Health

## 2020-01-18 ENCOUNTER — Ambulatory Visit (INDEPENDENT_AMBULATORY_CARE_PROVIDER_SITE_OTHER): Payer: Medicare Other | Admitting: Adult Health

## 2020-01-18 VITALS — BP 110/70 | HR 63 | Temp 96.6°F | Ht 63.0 in | Wt 170.0 lb

## 2020-01-18 DIAGNOSIS — M810 Age-related osteoporosis without current pathological fracture: Secondary | ICD-10-CM

## 2020-01-18 DIAGNOSIS — E782 Mixed hyperlipidemia: Secondary | ICD-10-CM

## 2020-01-18 DIAGNOSIS — R7309 Other abnormal glucose: Secondary | ICD-10-CM

## 2020-01-18 DIAGNOSIS — Z1329 Encounter for screening for other suspected endocrine disorder: Secondary | ICD-10-CM

## 2020-01-18 DIAGNOSIS — Z1389 Encounter for screening for other disorder: Secondary | ICD-10-CM

## 2020-01-18 DIAGNOSIS — I10 Essential (primary) hypertension: Secondary | ICD-10-CM

## 2020-01-18 DIAGNOSIS — Z0000 Encounter for general adult medical examination without abnormal findings: Secondary | ICD-10-CM | POA: Diagnosis not present

## 2020-01-18 DIAGNOSIS — N390 Urinary tract infection, site not specified: Secondary | ICD-10-CM

## 2020-01-18 DIAGNOSIS — F3341 Major depressive disorder, recurrent, in partial remission: Secondary | ICD-10-CM

## 2020-01-18 DIAGNOSIS — D649 Anemia, unspecified: Secondary | ICD-10-CM

## 2020-01-18 DIAGNOSIS — E559 Vitamin D deficiency, unspecified: Secondary | ICD-10-CM

## 2020-01-18 DIAGNOSIS — Z79899 Other long term (current) drug therapy: Secondary | ICD-10-CM | POA: Diagnosis not present

## 2020-01-18 DIAGNOSIS — Z136 Encounter for screening for cardiovascular disorders: Secondary | ICD-10-CM

## 2020-01-18 DIAGNOSIS — E663 Overweight: Secondary | ICD-10-CM

## 2020-01-18 MED ORDER — AMOXICILLIN 500 MG PO TABS: ORAL_TABLET | ORAL | 0 refills | Status: AC

## 2020-01-18 MED ORDER — ALENDRONATE SODIUM 70 MG PO TABS: 70.0000 mg | ORAL_TABLET | ORAL | 3 refills | Status: AC

## 2020-01-18 NOTE — Patient Instructions (Addendum)
Ms. Baumgarner , Thank you for taking time to come for your Annual Wellness Visit. I appreciate your ongoing commitment to your health goals. Please review the following plan we discussed and let me know if I can assist you in the future.   These are the goals we discussed: Goals    . Exercise 150 min/wk Moderate Activity    . Weight (lb) < 160 lb (72.6 kg)       This is a list of the screening recommended for you and due dates:  Health Maintenance  Topic Date Due  .  Hepatitis C: One time screening is recommended by Center for Disease Control  (CDC) for  adults born from [birth year] through 1965.   Never done  . Mammogram  08/22/2021  . Colon Cancer Screening  10/07/2023  . Tetanus Vaccine  01/07/2025  . Flu Shot  Completed  . DEXA scan (bone density measurement)  Completed  . COVID-19 Vaccine  Completed  . Pneumonia vaccines  Completed     Dr. Charlann Boxer - [phone]  Please clarify if needing dental prophylaxis just this time or indefinitely  Please restart weekly alendronate - reminded to take on empty stomach with 2 full glasses of water, and remain upright afterwards (don't go back to bed)     Know what a healthy weight is for you (roughly BMI <25) and aim to maintain this  Aim for 7+ servings of fruits and vegetables daily  65-80+ fluid ounces of water or unsweet tea for healthy kidneys  Limit to max 1 drink of alcohol per day; avoid smoking/tobacco  Limit animal fats in diet for cholesterol and heart health - choose grass fed whenever available  Avoid highly processed foods, and foods high in saturated/trans fats  Aim for low stress - take time to unwind and care for your mental health  Aim for 150 min of moderate intensity exercise weekly for heart health, and weights twice weekly for bone health  Aim for 7-9 hours of sleep daily     Exercising to Stay Healthy To become healthy and stay healthy, it is recommended that you do moderate-intensity and vigorous-intensity  exercise. You can tell that you are exercising at a moderate intensity if your heart starts beating faster and you start breathing faster but can still hold a conversation. You can tell that you are exercising at a vigorous intensity if you are breathing much harder and faster and cannot hold a conversation while exercising. Exercising regularly is important. It has many health benefits, such as:  Improving overall fitness, flexibility, and endurance.  Increasing bone density.  Helping with weight control.  Decreasing body fat.  Increasing muscle strength.  Reducing stress and tension.  Improving overall health. How often should I exercise? Choose an activity that you enjoy, and set realistic goals. Your health care provider can help you make an activity plan that works for you. Exercise regularly as told by your health care provider. This may include:  Doing strength training two times a week, such as: ? Lifting weights. ? Using resistance bands. ? Push-ups. ? Sit-ups. ? Yoga.  Doing a certain intensity of exercise for a given amount of time. Choose from these options: ? A total of 150 minutes of moderate-intensity exercise every week. ? A total of 75 minutes of vigorous-intensity exercise every week. ? A mix of moderate-intensity and vigorous-intensity exercise every week. Children, pregnant women, people who have not exercised regularly, people who are overweight, and older adults may  need to talk with a health care provider about what activities are safe to do. If you have a medical condition, be sure to talk with your health care provider before you start a new exercise program. What are some exercise ideas? Moderate-intensity exercise ideas include:  Walking 1 mile (1.6 km) in about 15 minutes.  Biking.  Hiking.  Golfing.  Dancing.  Water aerobics. Vigorous-intensity exercise ideas include:  Walking 4.5 miles (7.2 km) or more in about 1 hour.  Jogging or  running 5 miles (8 km) in about 1 hour.  Biking 10 miles (16.1 km) or more in about 1 hour.  Lap swimming.  Roller-skating or in-line skating.  Cross-country skiing.  Vigorous competitive sports, such as football, basketball, and soccer.  Jumping rope.  Aerobic dancing. What are some everyday activities that can help me to get exercise?  Yard work, such as: ? Pushing a Surveyor, mining. ? Raking and bagging leaves.  Washing your car.  Pushing a stroller.  Shoveling snow.  Gardening.  Washing windows or floors. How can I be more active in my day-to-day activities?  Use stairs instead of an elevator.  Take a walk during your lunch break.  If you drive, park your car farther away from your work or school.  If you take public transportation, get off one stop early and walk the rest of the way.  Stand up or walk around during all of your indoor phone calls.  Get up, stretch, and walk around every 30 minutes throughout the day.  Enjoy exercise with a friend. Support to continue exercising will help you keep a regular routine of activity. What guidelines can I follow while exercising?  Before you start a new exercise program, talk with your health care provider.  Do not exercise so much that you hurt yourself, feel dizzy, or get very short of breath.  Wear comfortable clothes and wear shoes with good support.  Drink plenty of water while you exercise to prevent dehydration or heat stroke.  Work out until your breathing and your heartbeat get faster. Where to find more information  U.S. Department of Health and Human Services: ThisPath.fi  Centers for Disease Control and Prevention (CDC): FootballExhibition.com.br Summary  Exercising regularly is important. It will improve your overall fitness, flexibility, and endurance.  Regular exercise also will improve your overall health. It can help you control your weight, reduce stress, and improve your bone density.  Do not exercise  so much that you hurt yourself, feel dizzy, or get very short of breath.  Before you start a new exercise program, talk with your health care provider. This information is not intended to replace advice given to you by your health care provider. Make sure you discuss any questions you have with your health care provider. Document Revised: 02/12/2017 Document Reviewed: 01/21/2017 Elsevier Patient Education  2020 [employer].

## 2020-01-19 ENCOUNTER — Other Ambulatory Visit: Payer: Self-pay | Admitting: Adult Health

## 2020-01-19 LAB — URINALYSIS, ROUTINE W REFLEX MICROSCOPIC
Bilirubin Urine: NEGATIVE
Glucose, UA: NEGATIVE
Hgb urine dipstick: NEGATIVE
Hyaline Cast: NONE SEEN /LPF
Ketones, ur: NEGATIVE
Nitrite: POSITIVE — AB
Protein, ur: NEGATIVE
RBC / HPF: NONE SEEN /HPF (ref 0–2)
Specific Gravity, Urine: 1.007 (ref 1.001–1.03)
Squamous Epithelial / HPF: NONE SEEN /HPF (ref <=5)
pH: 7 (ref 5.0–8.0)

## 2020-01-19 LAB — COMPLETE METABOLIC PANEL WITH GFR
AG Ratio: 1.2 (calc) (ref 1.0–2.5)
ALT: 13 U/L (ref 6–29)
AST: 26 U/L (ref 10–35)
Albumin: 4 g/dL (ref 3.6–5.1)
Alkaline phosphatase (APISO): 71 U/L (ref 37–153)
BUN: 11 mg/dL (ref 7–25)
CO2: 28 mmol/L (ref 20–32)
Calcium: 9.7 mg/dL (ref 8.6–10.4)
Chloride: 102 mmol/L (ref 98–110)
Creat: 0.78 mg/dL (ref 0.60–0.93)
GFR, Est African American: 87 mL/min/{1.73_m2} (ref >=60)
GFR, Est Non African American: 75 mL/min/{1.73_m2} (ref >=60)
Globulin: 3.4 g/dL (calc) (ref 1.9–3.7)
Glucose, Bld: 114 mg/dL — ABNORMAL HIGH (ref 65–99)
Potassium: 4.3 mmol/L (ref 3.5–5.3)
Sodium: 141 mmol/L (ref 135–146)
Total Bilirubin: 0.5 mg/dL (ref 0.2–1.2)
Total Protein: 7.4 g/dL (ref 6.1–8.1)

## 2020-01-19 LAB — CBC WITH DIFFERENTIAL/PLATELET
Absolute Monocytes: 659 cells/uL (ref 200–950)
Basophils Absolute: 71 cells/uL (ref 0–200)
Basophils Relative: 0.8 %
Eosinophils Absolute: 267 cells/uL (ref 15–500)
Eosinophils Relative: 3 %
HCT: 45.3 % — ABNORMAL HIGH (ref 35.0–45.0)
Hemoglobin: 15.3 g/dL (ref 11.7–15.5)
Lymphs Abs: 2163 cells/uL (ref 850–3900)
MCH: 32.1 pg (ref 27.0–33.0)
MCHC: 33.8 g/dL (ref 32.0–36.0)
MCV: 95 fL (ref 80.0–100.0)
MPV: 9.5 fL (ref 7.5–12.5)
Monocytes Relative: 7.4 %
Neutro Abs: 5741 cells/uL (ref 1500–7800)
Neutrophils Relative %: 64.5 %
Platelets: 478 10*3/uL — ABNORMAL HIGH (ref 140–400)
RBC: 4.77 10*6/uL (ref 3.80–5.10)
RDW: 13.4 % (ref 11.0–15.0)
Total Lymphocyte: 24.3 %
WBC: 8.9 10*3/uL (ref 3.8–10.8)

## 2020-01-19 LAB — LIPID PANEL
Cholesterol: 151 mg/dL (ref <200)
HDL: 39 mg/dL — ABNORMAL LOW (ref >=50)
LDL Cholesterol (Calc): 67 mg/dL (calc)
Non-HDL Cholesterol (Calc): 112 mg/dL (calc) (ref <130)
Total CHOL/HDL Ratio: 3.9 (calc) (ref <5.0)
Triglycerides: 374 mg/dL — ABNORMAL HIGH (ref <150)

## 2020-01-19 LAB — VITAMIN B12: Vitamin B-12: 694 pg/mL (ref 200–1100)

## 2020-01-19 LAB — MICROALBUMIN / CREATININE URINE RATIO
Creatinine, Urine: 24 mg/dL (ref 20–275)
Microalb Creat Ratio: 8 mcg/mg creat (ref <30)
Microalb, Ur: 0.2 mg/dL

## 2020-01-19 LAB — MAGNESIUM: Magnesium: 2.2 mg/dL (ref 1.5–2.5)

## 2020-01-19 LAB — TSH: TSH: 1.92 mIU/L (ref 0.40–4.50)

## 2020-01-19 LAB — HEMOGLOBIN A1C
Hgb A1c MFr Bld: 6.4 % of total Hgb — ABNORMAL HIGH (ref <5.7)
Mean Plasma Glucose: 137 (calc)
eAG (mmol/L): 7.6 (calc)

## 2020-01-19 LAB — VITAMIN D 25 HYDROXY (VIT D DEFICIENCY, FRACTURES): Vit D, 25-Hydroxy: 104 ng/mL — ABNORMAL HIGH (ref 30–100)

## 2020-03-05 ENCOUNTER — Other Ambulatory Visit: Payer: Self-pay | Admitting: Internal Medicine

## 2020-03-13 ENCOUNTER — Other Ambulatory Visit: Payer: Self-pay | Admitting: Adult Health

## 2020-03-13 MED ORDER — ESCITALOPRAM OXALATE 20 MG PO TABS: ORAL_TABLET | ORAL | 3 refills | Status: AC

## 2020-03-26 ENCOUNTER — Other Ambulatory Visit: Payer: Self-pay | Admitting: Internal Medicine

## 2020-03-26 DIAGNOSIS — F411 Generalized anxiety disorder: Secondary | ICD-10-CM

## 2020-05-21 ENCOUNTER — Ambulatory Visit: Payer: Medicare Other | Admitting: Internal Medicine

## 2020-05-27 ENCOUNTER — Other Ambulatory Visit: Payer: Self-pay | Admitting: Adult Health

## 2020-05-27 ENCOUNTER — Telehealth: Payer: Self-pay

## 2020-05-27 DIAGNOSIS — F411 Generalized anxiety disorder: Secondary | ICD-10-CM

## 2020-05-27 MED ORDER — ALPRAZOLAM 0.5 MG PO TABS: ORAL_TABLET | ORAL | 0 refills | Status: DC

## 2020-05-27 NOTE — Telephone Encounter (Signed)
Refill request for Alprazolam 

## 2020-05-27 NOTE — Progress Notes (Signed)
Future Appointments  Date Time Provider Department Center  07/15/2020  2:00 PM Judd Gaudier, NP [facility] None  01/20/2021  3:00 PM Judd Gaudier, NP [facility] None   PDMP reviewed for xanax refill request.

## 2020-05-28 MED ORDER — ALPRAZOLAM 0.5 MG PO TABS: ORAL_TABLET | ORAL | 0 refills | Status: AC

## 2020-05-28 NOTE — Addendum Note (Signed)
Addended by: Dan Maker on: 05/28/2020 07:05 AM   Modules accepted: Orders

## 2020-06-24 ENCOUNTER — Encounter: Payer: Medicare Other | Admitting: Adult Health

## 2020-07-15 ENCOUNTER — Ambulatory Visit: Payer: Medicare Other | Admitting: Adult Health

## 2020-07-16 ENCOUNTER — Other Ambulatory Visit: Payer: Self-pay | Admitting: Adult Health

## 2021-01-13 ENCOUNTER — Ambulatory Visit: Payer: Medicare Other | Admitting: Adult Health Nurse Practitioner

## 2021-01-20 ENCOUNTER — Ambulatory Visit: Payer: Medicare Other | Admitting: Adult Health

## 2021-01-20 ENCOUNTER — Encounter: Payer: Self-pay | Admitting: Adult Health

## 2021-06-10 ENCOUNTER — Other Ambulatory Visit: Payer: Self-pay | Admitting: General Practice

## 2021-06-10 DIAGNOSIS — Z13820 Encounter for screening for osteoporosis: Secondary | ICD-10-CM

## 2021-06-10 DIAGNOSIS — Z780 Asymptomatic menopausal state: Secondary | ICD-10-CM

## 2021-07-15 ENCOUNTER — Ambulatory Visit: Payer: Self-pay | Admitting: Adult Health

## 2022-05-21 IMAGING — RF DG HIP (WITH PELVIS) OPERATIVE*R*
1 series · 2 of 2 positions shown · non-contrast
Comparison: None.

CLINICAL DATA: Right hip replacement

EXAM:
OPERATIVE RIGHT HIP WITH PELVIS

[Series 1: unknown protocol · 0.20mm/px · 2 of 2 slices shown]
[im 1/2]
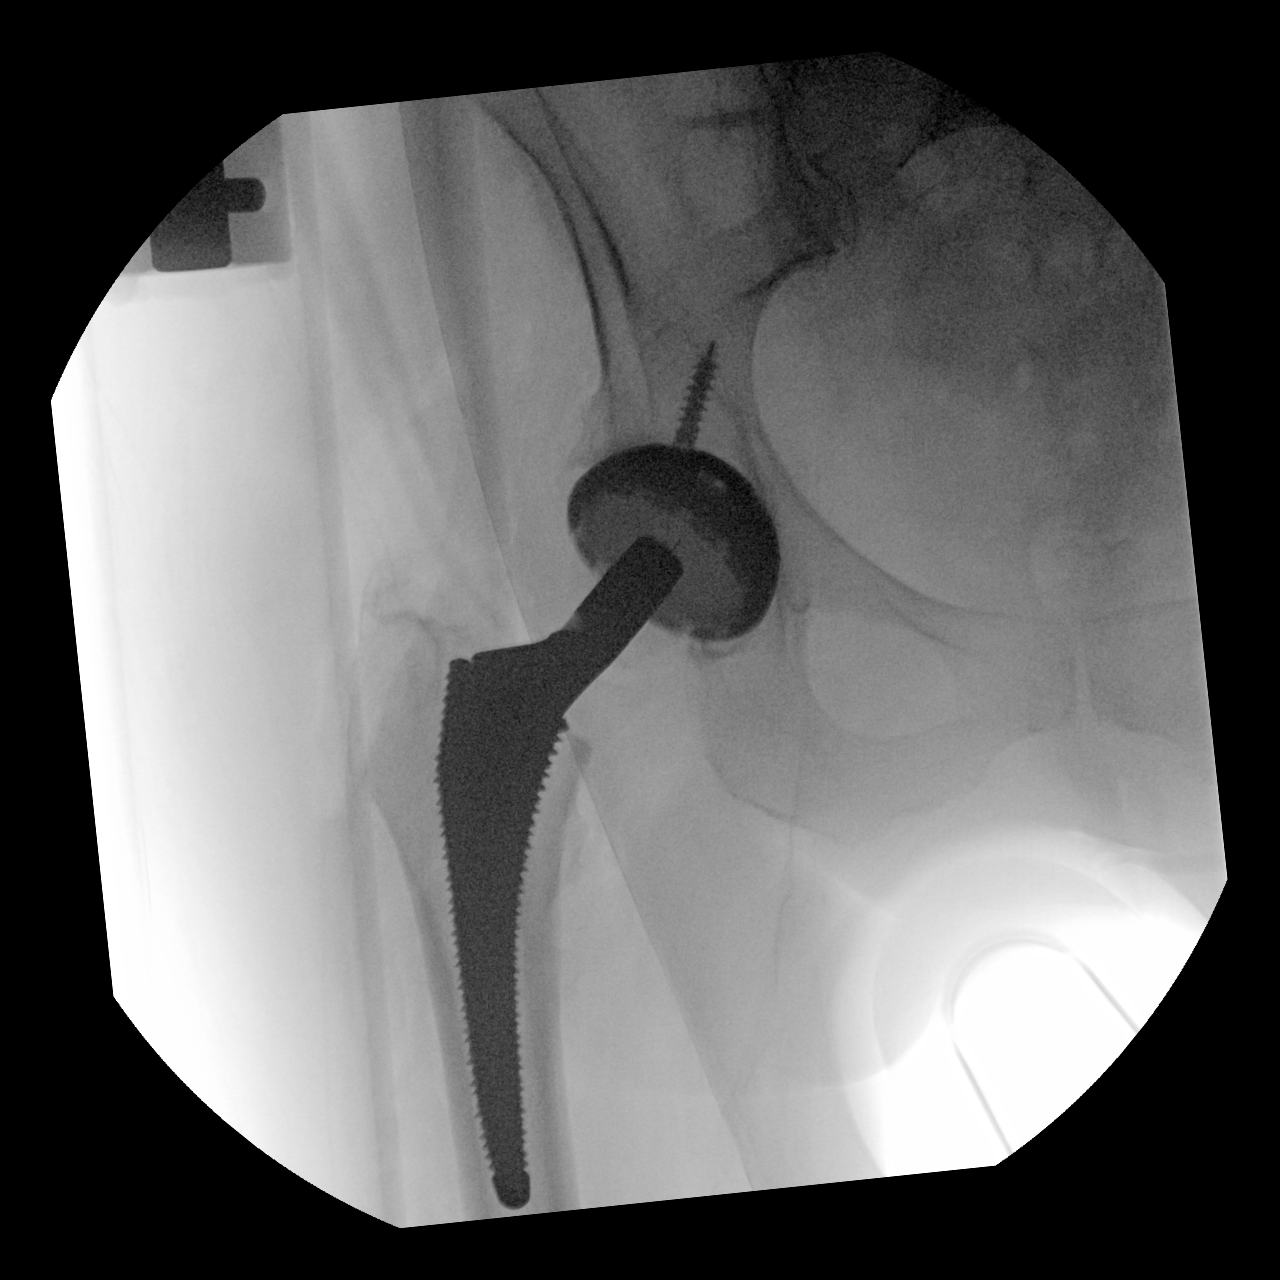
[im 2/2]
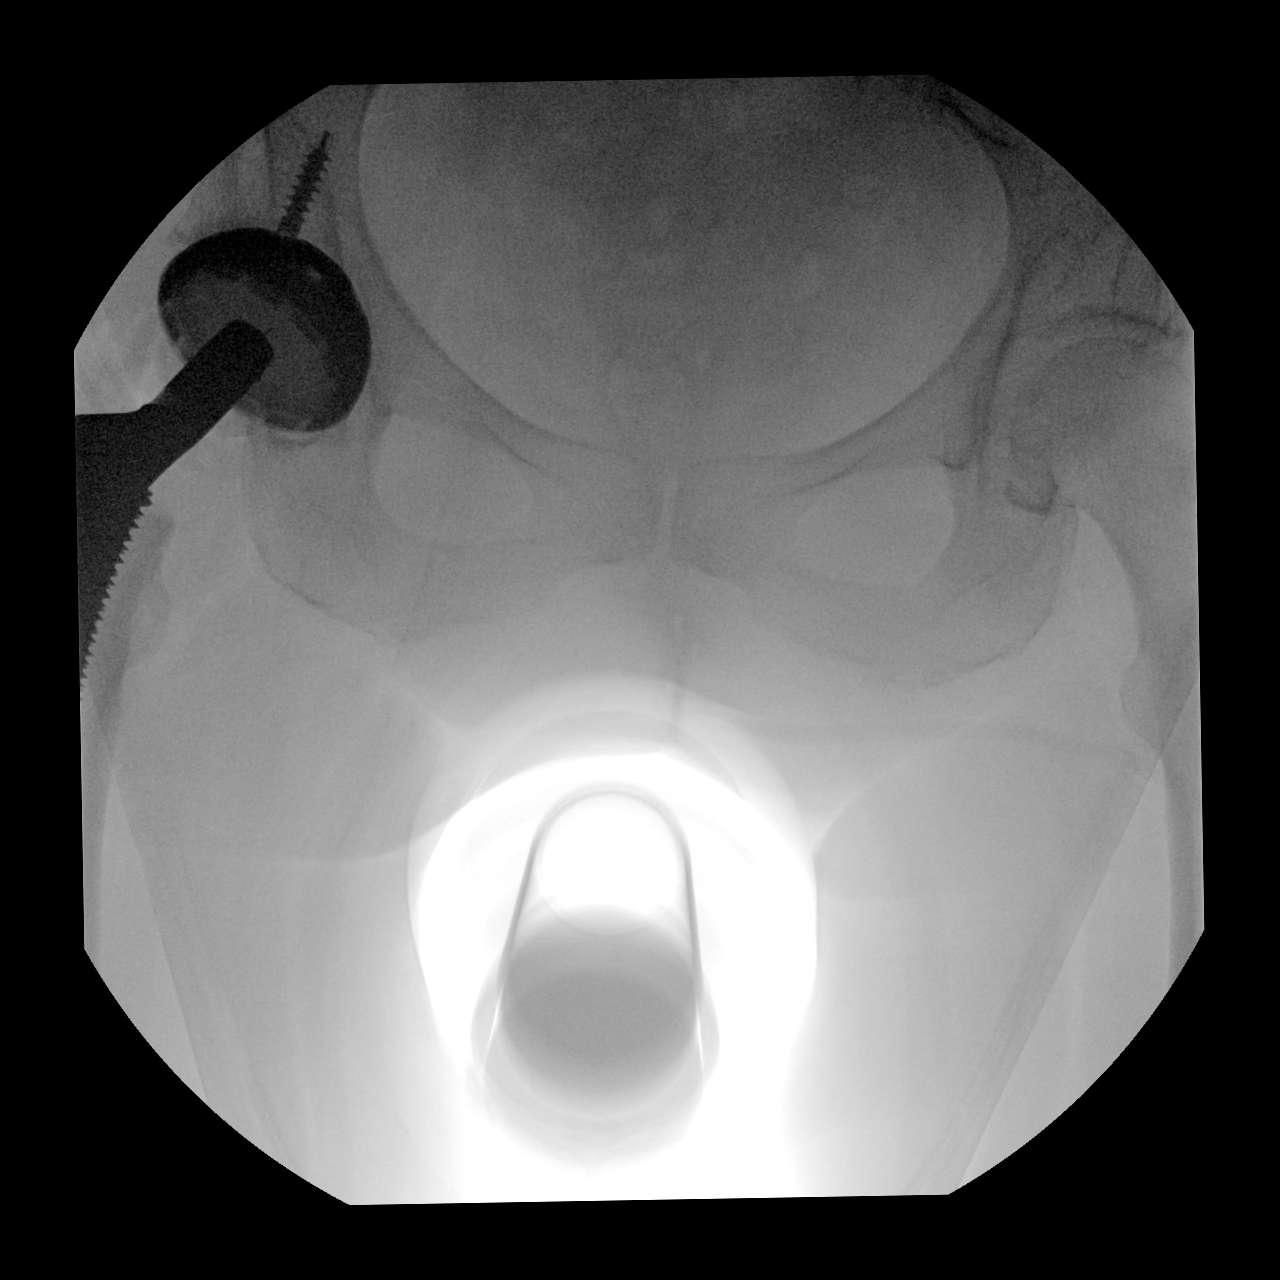

[2 of 2 positions shown; findings below may reference images not displayed]

FLUOROSCOPY TIME:  Radiation Exposure Index (as provided by the
fluoroscopic device): 1.29 mGy

If the device does not provide the exposure index:

Fluoroscopy Time:  10

Number of Acquired Images:  2
FINDINGS: Right hip replacement is noted in satisfactory position. No acute
bony or soft tissue abnormality is seen.
IMPRESSION: Status post right hip replacement
